# Patient Record
Sex: Female | Born: 1950
Health system: Southern US, Community
[De-identification: ages and names within clinical notes are randomized; demographics above are authoritative.]

## PROBLEM LIST (undated history)

## (undated) DIAGNOSIS — E119 Type 2 diabetes mellitus without complications: Secondary | ICD-10-CM

## (undated) DIAGNOSIS — I10 Essential (primary) hypertension: Secondary | ICD-10-CM

## (undated) DIAGNOSIS — E559 Vitamin D deficiency, unspecified: Secondary | ICD-10-CM

## (undated) HISTORY — DX: Vitamin D deficiency, unspecified: E55.9

---

## 2005-04-14 ENCOUNTER — Emergency Department (HOSPITAL_COMMUNITY): Admission: EM | Admit: 2005-04-14 | Discharge: 2005-04-14 | Payer: Self-pay | Admitting: Emergency Medicine

## 2013-06-29 ENCOUNTER — Encounter (HOSPITAL_COMMUNITY): Payer: Self-pay | Admitting: *Deleted

## 2013-06-29 ENCOUNTER — Emergency Department (HOSPITAL_COMMUNITY): Payer: Self-pay

## 2013-06-29 ENCOUNTER — Emergency Department (HOSPITAL_COMMUNITY)
Admission: EM | Admit: 2013-06-29 | Discharge: 2013-06-29 | Disposition: A | Discharge status: Home / Self Care | Payer: Self-pay | Attending: Emergency Medicine | Admitting: Emergency Medicine

## 2013-06-29 DIAGNOSIS — Y9239 Other specified sports and athletic area as the place of occurrence of the external cause: Secondary | ICD-10-CM | POA: Insufficient documentation

## 2013-06-29 DIAGNOSIS — S93609A Unspecified sprain of unspecified foot, initial encounter: Secondary | ICD-10-CM | POA: Insufficient documentation

## 2013-06-29 DIAGNOSIS — X500XXA Overexertion from strenuous movement or load, initial encounter: Secondary | ICD-10-CM | POA: Insufficient documentation

## 2013-06-29 DIAGNOSIS — Y9389 Activity, other specified: Secondary | ICD-10-CM | POA: Insufficient documentation

## 2013-06-29 DIAGNOSIS — E119 Type 2 diabetes mellitus without complications: Secondary | ICD-10-CM | POA: Insufficient documentation

## 2013-06-29 DIAGNOSIS — S93601A Unspecified sprain of right foot, initial encounter: Secondary | ICD-10-CM

## 2013-06-29 DIAGNOSIS — I10 Essential (primary) hypertension: Secondary | ICD-10-CM | POA: Insufficient documentation

## 2013-06-29 DIAGNOSIS — R609 Edema, unspecified: Secondary | ICD-10-CM | POA: Insufficient documentation

## 2013-06-29 DIAGNOSIS — Z880 Allergy status to penicillin: Secondary | ICD-10-CM | POA: Insufficient documentation

## 2013-06-29 HISTORY — DX: Type 2 diabetes mellitus without complications: E11.9

## 2013-06-29 HISTORY — DX: Essential (primary) hypertension: I10

## 2013-06-29 MED ORDER — LISINOPRIL-HYDROCHLOROTHIAZIDE 20-12.5 MG PO TABS: 2.0000 | ORAL_TABLET | Freq: Every day | ORAL | Status: DC

## 2013-06-29 NOTE — ED Notes (Signed)
Pt c/o right foot pain and swelling since Sunday night. Pt states she tripped over a chair and heard a "pop". Pt ambulatory on arrival but reports increased pain with walking.

## 2013-06-29 NOTE — ED Notes (Signed)
Pain lt foot/ankle -[place] beach [place]. Swelling

## 2013-06-30 NOTE — ED Provider Notes (Signed)
CSN: [id number]     Arrival date & time 06/29/13  2043 History     First MD Initiated Contact with Patient 06/29/13 2052     Chief Complaint  Patient presents with  . Foot Pain   (Consider location/radiation/quality/duration/timing/severity/associated sxs/prior Treatment) HPI Comments: Sheryl Mcknight is a 62 y.o. Female presenting with pain and swelling along the lateral and dorsal right foot after an injury 2 days ago, describing falling out of a chair,  And getting her right foot caught in the rungs,  Causing a twisting injury.  She has been weight bearing since the event,  But with increased pain and worse swelling toward the end of the day.  She has taken no medicines for this injury. Her pain is constant and aching without radiation into her leg.  She denies other injury.       The history is provided by the patient.    Past Medical History  Diagnosis Date  . Hypertension   . Diabetes mellitus without complication    Past Surgical History  Procedure Laterality Date  . Cesarean section     History reviewed. No pertinent family history. History  Substance Use Topics  . Smoking status: Never Smoker   . Smokeless tobacco: Not on file  . Alcohol Use: No   OB History   Grav Para Term Preterm Abortions TAB SAB Ect Mult Living                 Review of Systems  Musculoskeletal: Positive for joint swelling and arthralgias.  Skin: Negative for wound.  Neurological: Negative for weakness and numbness.    Allergies  Penicillins  Home Medications   Current Outpatient Rx  Name  Route  Sig  Dispense  Refill  . lisinopril-hydrochlorothiazide (ZESTORETIC) 20-12.5 MG per tablet   Oral   Take 2 tablets by mouth daily.   60 tablet   1    BP 184/90  Pulse 99  Temp(Src) 98.4 F (36.9 C) (Oral)  Resp 20  Ht 5\' 7"  (1.702 m)  SpO2 97% Physical Exam  Nursing note and vitals reviewed. Constitutional: She appears well-developed and well-nourished.  HENT:  Head:  Normocephalic.  Cardiovascular: Normal rate and intact distal pulses.  Exam reveals no decreased pulses.   Pulses:      Dorsalis pedis pulses are 2+ on the right side, and 2+ on the left side.       Posterior tibial pulses are 2+ on the right side, and 2+ on the left side.  Musculoskeletal: She exhibits edema and tenderness.       Right foot: She exhibits bony tenderness and swelling. She exhibits normal capillary refill, no crepitus and no deformity.       Feet:  Generalized edema of dorsal foot with point tenderness proximal foot.  She has ecchymosis along lateral edge of foot.  Edema noted at lateral malleolus, but non tender at this site. No proxial fibular tenderness.  Neurological: She is alert. No sensory deficit.  Skin: Skin is warm, dry and intact.    ED Course   Procedures (including critical care time)  Labs Reviewed - No data to display Dg Foot Complete Right  06/29/2013   *RADIOLOGY REPORT*  Clinical Data: Foot pain.  Trauma.  RIGHT FOOT COMPLETE - 3+ VIEW  Comparison: None.  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  There is no fracture.  Metatarsals appear intact.  Phalanges appear within normal limits.  Calcaneal spurs incidentally noted.  There is a tiny calcification dorsal to the navicular bone, compatible with capsular avulsion.  This is only seen on the lateral view.  IMPRESSION: Dorsal capsular avulsion from the navicular bone.   Original Report Authenticated By: Andreas Newport, M.D.   1. FSPR (foot sprain), right, initial encounter   2. Hypertension     MDM  Pt placed in aso,  Crutches given, advised RICE,  Referral to ortho for f/u care.  Also discussed elevated bp.  She states has been out of her lisinopril/hctz since lost insurance,  Unable to be seen by her prior pcp.  She denies headache, visual changes, dizziness, chest pain.  She was prescribed refill of her htn medicine.  She plans to f/u with the health dept for management of her ongoing  medical issues.  She does keep up with her cbg's, checked this week and 120.  Diet controlled.    Burgess Amor, PA-C 06/30/13 0126

## 2013-06-30 NOTE — ED Provider Notes (Signed)
Medical screening examination/treatment/procedure(s) were performed by non-physician practitioner and as supervising physician I was immediately available for consultation/collaboration.   Junius Argyle, MD 06/30/13 1126

## 2013-07-01 ENCOUNTER — Ambulatory Visit (INDEPENDENT_AMBULATORY_CARE_PROVIDER_SITE_OTHER): Payer: Self-pay | Admitting: Orthopedic Surgery

## 2013-07-01 ENCOUNTER — Encounter: Payer: Self-pay | Admitting: Orthopedic Surgery

## 2013-07-01 VITALS — BP 153/99 | Ht 67.0 in | Wt 186.0 lb

## 2013-07-01 DIAGNOSIS — S93601A Unspecified sprain of right foot, initial encounter: Secondary | ICD-10-CM

## 2013-07-01 DIAGNOSIS — S93609A Unspecified sprain of unspecified foot, initial encounter: Secondary | ICD-10-CM | POA: Insufficient documentation

## 2013-07-01 NOTE — Patient Instructions (Addendum)
Weight bearing as tolerated   Stop wearing ankle wrap

## 2013-07-01 NOTE — Progress Notes (Signed)
Patient ID: Sheryl Mcknight, female   DOB: August 06, 1951, 62 y.o.   MRN: [id number]  Chief Complaint  Patient presents with  . Foot Pain    Left foot fracture d/t injuryI 06/26/13    This patient twisted her foot when she stepped over a chair the foot slid under the chair and was entrapped  She really complains of mild 1/10 sharp throbbing intermittent pain that she had some initial bruising and swelling which has persisted she has some tingling otherwise doing well she has a history of wheezing heartburn or medical history as recorded vital signs are BP 153/99  Ht 5\' 7"  (1.702 m)  Wt 186 lb (84.369 kg)  BMI 29.12 kg/m2  Her appearance is normal she is oriented x3 mood is good her gait is normal she has some tenderness over the dorsum of the foot lateral ligaments of the ankle are normal ankle range of motion is normal ankle is stable motor exam is normal without atrophy skin is intact but bruised pulses good sensation is normal  Impression x-ray showed dorsal avulsion from the foot no ankle injury  Impression sprained foot  Activities as tolerated avoid strenuous activity or running for 4 weeks

## 2018-08-25 DIAGNOSIS — Z23 Encounter for immunization: Secondary | ICD-10-CM | POA: Diagnosis not present

## 2018-08-25 DIAGNOSIS — E559 Vitamin D deficiency, unspecified: Secondary | ICD-10-CM | POA: Diagnosis not present

## 2018-08-25 DIAGNOSIS — E119 Type 2 diabetes mellitus without complications: Secondary | ICD-10-CM | POA: Diagnosis not present

## 2018-08-25 DIAGNOSIS — N951 Menopausal and female climacteric states: Secondary | ICD-10-CM | POA: Diagnosis not present

## 2018-08-25 DIAGNOSIS — I10 Essential (primary) hypertension: Secondary | ICD-10-CM | POA: Diagnosis not present

## 2018-08-25 DIAGNOSIS — G4700 Insomnia, unspecified: Secondary | ICD-10-CM | POA: Diagnosis not present

## 2018-10-22 DIAGNOSIS — I10 Essential (primary) hypertension: Secondary | ICD-10-CM | POA: Diagnosis not present

## 2018-10-22 DIAGNOSIS — E1165 Type 2 diabetes mellitus with hyperglycemia: Secondary | ICD-10-CM | POA: Diagnosis not present

## 2018-12-29 DIAGNOSIS — E785 Hyperlipidemia, unspecified: Secondary | ICD-10-CM | POA: Diagnosis not present

## 2018-12-29 DIAGNOSIS — G4700 Insomnia, unspecified: Secondary | ICD-10-CM | POA: Diagnosis not present

## 2018-12-29 DIAGNOSIS — E1165 Type 2 diabetes mellitus with hyperglycemia: Secondary | ICD-10-CM | POA: Diagnosis not present

## 2018-12-29 DIAGNOSIS — I10 Essential (primary) hypertension: Secondary | ICD-10-CM | POA: Diagnosis not present

## 2019-03-03 ENCOUNTER — Ambulatory Visit (INDEPENDENT_AMBULATORY_CARE_PROVIDER_SITE_OTHER): Payer: Self-pay | Admitting: Nurse Practitioner

## 2019-03-03 ENCOUNTER — Other Ambulatory Visit (INDEPENDENT_AMBULATORY_CARE_PROVIDER_SITE_OTHER): Payer: Self-pay

## 2019-05-04 DIAGNOSIS — I10 Essential (primary) hypertension: Secondary | ICD-10-CM | POA: Diagnosis not present

## 2019-05-04 DIAGNOSIS — E559 Vitamin D deficiency, unspecified: Secondary | ICD-10-CM | POA: Diagnosis not present

## 2019-05-04 DIAGNOSIS — E1165 Type 2 diabetes mellitus with hyperglycemia: Secondary | ICD-10-CM | POA: Diagnosis not present

## 2019-08-02 ENCOUNTER — Other Ambulatory Visit (INDEPENDENT_AMBULATORY_CARE_PROVIDER_SITE_OTHER): Payer: Self-pay | Admitting: Internal Medicine

## 2019-08-19 ENCOUNTER — Ambulatory Visit (INDEPENDENT_AMBULATORY_CARE_PROVIDER_SITE_OTHER): Payer: Self-pay | Admitting: Internal Medicine

## 2019-09-02 ENCOUNTER — Other Ambulatory Visit (INDEPENDENT_AMBULATORY_CARE_PROVIDER_SITE_OTHER): Payer: Self-pay | Admitting: Internal Medicine

## 2019-09-02 MED ORDER — LISINOPRIL 20 MG PO TABS: 20.0000 mg | ORAL_TABLET | Freq: Two times a day (BID) | ORAL | 0 refills | Status: DC

## 2019-10-05 ENCOUNTER — Encounter (INDEPENDENT_AMBULATORY_CARE_PROVIDER_SITE_OTHER): Payer: Self-pay | Admitting: Internal Medicine

## 2019-10-05 ENCOUNTER — Ambulatory Visit (INDEPENDENT_AMBULATORY_CARE_PROVIDER_SITE_OTHER): Payer: Medicare Other | Admitting: Internal Medicine

## 2019-10-05 ENCOUNTER — Other Ambulatory Visit: Payer: Self-pay

## 2019-10-05 VITALS — BP 140/70 | HR 90 | Temp 97.9°F | Resp 18 | Ht 66.0 in | Wt 167.0 lb

## 2019-10-05 DIAGNOSIS — E559 Vitamin D deficiency, unspecified: Secondary | ICD-10-CM

## 2019-10-05 DIAGNOSIS — E119 Type 2 diabetes mellitus without complications: Secondary | ICD-10-CM | POA: Diagnosis not present

## 2019-10-05 DIAGNOSIS — I10 Essential (primary) hypertension: Secondary | ICD-10-CM | POA: Diagnosis not present

## 2019-10-05 NOTE — Patient Instructions (Signed)
[facility] Dietary Recommendations for Weight Loss What to Avoid . Avoid added sugars o Often added sugar can be found in processed foods such as many condiments, dry cereals, cakes, cookies, chips, crisps, crackers, candies, sweetened drinks, etc.  o Read labels and AVOID/DECREASE use of foods with the following in their ingredient list: Sugar, fructose, high fructose corn syrup, sucrose, glucose, maltose, dextrose, molasses, cane sugar, brown sugar, any type of syrup, agave nectar, etc.   . Avoid snacking in between meals . Avoid foods made with flour o If you are going to eat food made with flour, choose those made with whole-grains; and, minimize your consumption as much as is tolerable . Avoid processed foods o These foods are generally stocked in the middle of the grocery store. Focus on shopping on the perimeter of the grocery.  What to Include . Vegetables o GREEN LEAFY VEGETABLES: Kale, spinach, mustard greens, collard greens, cabbage, broccoli, etc. o OTHER: Asparagus, cauliflower, eggplant, carrots, peas, Brussel sprouts, tomatoes, bell peppers, zucchini, beets, cucumbers, etc. . Grains, seeds, and legumes o Beans: kidney beans, black eyed peas, garbanzo beans, black beans, pinto beans, etc. o Whole, unrefined grains: brown rice, barley, bulgur, oatmeal, etc. . Healthy fats  o Avoid highly processed fats such as vegetable oil o Examples of healthy fats: avocado, olives, virgin olive oil, dark chocolate (?72% Cocoa), nuts (peanuts, almonds, walnuts, cashews, pecans, etc.) . Low - Moderate Intake of Animal Sources of Protein o Meat sources: chicken, turkey, salmon, tuna. Limit to 4 ounces of meat at one time. o Consider limiting dairy sources, but when choosing dairy focus on: PLAIN Greek yogurt, cottage cheese, high-protein milk . Fruit o Choose berries  When to Eat . Intermittent Fasting: o Choosing not to eat for a specific time period, but DO FOCUS ON HYDRATION  when fasting o Multiple Techniques: - Time Restricted Eating: eat 3 meals in a day, each meal lasting no more than 60 minutes, no snacks between meals - 16-18 hour fast: fast for 16 to 18 hours up to 7 days a week. Often suggested to start with 2-3 nonconsecutive days per week.  . Remember the time you sleep is counted as fasting.  . Examples of eating schedule: Fast from 7:00pm-11:00am. Eat between 11:00am-7:00pm.  - 24-hour fast: fast for 24 hours up to every other day. Often suggested to start with 1 day per week . Remember the time you sleep is counted as fasting . Examples of eating schedule:  o Eating day: eat 2-3 meals on your eating day. If doing 2 meals, each meal should last no more than 90 minutes. If doing 3 meals, each meal should last no more than 60 minutes. Finish last meal by 7:00pm. o Fasting day: Fast until 7:00pm.  o IF YOU FEEL UNWELL FOR ANY REASON/IN ANY WAY WHEN FASTING, STOP FASTING BY EATING A NUTRITIOUS SNACK OR LIGHT MEAL o ALWAYS FOCUS ON HYDRATION DURING FASTS - Acceptable Hydration sources: water, broths, tea/coffee (black tea/coffee is best but using a small amount of whole-fat dairy products in coffee/tea is acceptable).  - Poor Hydration Sources: anything with sugar or artificial sweeteners added to it  These recommendations have been developed for patients that are actively receiving medical care from either Dr. Chareese Sergent or Sarah Gray, DNP, NP-C at [facility]. These recommendations are developed for patients with specific medical conditions and are not meant to be distributed or used by others that are not actively receiving care from either provider listed   above at [facility]. It is not appropriate to participate in the above eating plans without proper medical supervision.   Reference: Fung, J. The obesity code. [place]: [place]; 2016.   

## 2019-10-05 NOTE — Progress Notes (Signed)
Metrics: Intervention Frequency ACO  Documented Smoking Status Yearly  Screened one or more times in 24 months  Cessation Counseling or  Active cessation medication Past 24 months  Past 24 months   Guideline developer: UpToDate (See UpToDate for funding source) Date Released: 2014       Wellness Office Visit  Subjective:  Patient ID: Sheryl Mcknight, female    DOB: Feb 21, 1951  Age: 68 y.o. MRN: [id number]  CC: This lady comes in for follow-up of diabetes, hypertension and vitamin D deficiency. HPI  She has been doing reasonably well in terms of her diet.  Unfortunately, she finds it very hard to take Metformin because of GI side effects.  Nevertheless, she still perseveres with it. She continues with lisinopril for hypertension and seems to be tolerating it quite well.  She denies any chest pain, dyspnea, palpitations or limb weakness. She has been taking vitamin D3 supplementation but she cannot remember the dose and she will let me know after she gets home. Past Medical History:  Diagnosis Date  . Diabetes mellitus without complication ([place])   . Hypertension   . Vitamin D deficiency disease       History reviewed. No pertinent family history.  Social History   Social History Narrative   [place] murdered 20 yrs ago.Lives with 3 sons.Retired-previously worked in [employer] .   Social History   Tobacco Use  . Smoking status: Never Smoker  . Smokeless tobacco: Never Used  Substance Use Topics  . Alcohol use: No    Current Meds  Medication Sig  . acetaminophen (TYLENOL) 500 MG tablet Take 500 mg by mouth daily.  . cetirizine (ZYRTEC) 10 MG tablet Take 10 mg by mouth daily.  Marland Kitchen lisinopril (ZESTRIL) 20 MG tablet Take 1 tablet (20 mg total) by mouth 2 (two) times daily.  . metFORMIN (GLUCOPHAGE) 1000 MG tablet TAKE 1/2 (ONE-HALF) TABLET BY MOUTH TWICE DAILY - NEW DOSE        Objective:   Today's Vitals: BP 140/70 (BP Location: Left Arm, Patient Position: Sitting,  Cuff Size: Normal)   Pulse 90   Temp 97.9 F (36.6 C) (Temporal)   Resp 18   Ht '5\' 6"'  (1.676 m)   Wt 167 lb (75.8 kg)   SpO2 99% Comment: with mask  BMI 26.95 kg/m  Vitals with BMI 10/05/2019 07/01/2013 06/29/2013  Height '5\' 6"'  '5\' 7"'  '5\' 7"'   Weight 167 lbs 186 lbs -  BMI 63.84 53.6 -  Systolic 468 032 122  Diastolic 70 99 90  Pulse 90 - 99     Physical Exam  She looks systemically well.  Blood pressure is better controlled than it was last time now.  She is alert and orientated without any focal neurological signs.     Assessment   1. Vitamin D deficiency disease   2. Essential hypertension   3. Diabetes mellitus without complication ([place])       Tests ordered Orders Placed This Encounter  Procedures  . CMP with eGFR([facility])  . Hemoglobin A1c  . Vitamin D, 25-hydroxy     Plan: 1. Blood work is ordered above as above. 2. She will continue with vitamin D3 supplementation and we may need to adjust the dose depending on the level and what dose she is actually taking. 3. She will continue with lisinopril for hypertension and this seems to be controlling her blood pressure. 4. She will continue with Metformin for the time being but depending on her A1c, I may  need to change the medication as she does not seem to be tolerating this very well at all. 5. She will also come back next week to get influenza and pneumococcal 23 vaccination. 6. Further recommendations will depend on blood results and I will see her in 3 months time for follow-up.   No orders of the defined types were placed in this encounter.   Doree Albee, MD

## 2019-10-06 LAB — COMPLETE METABOLIC PANEL WITH GFR
AG Ratio: 1.3 (calc) (ref 1.0–2.5)
ALT: 23 U/L (ref 6–29)
AST: 20 U/L (ref 10–35)
Albumin: 4.4 g/dL (ref 3.6–5.1)
Alkaline phosphatase (APISO): 90 U/L (ref 37–153)
BUN: 20 mg/dL (ref 7–25)
CO2: 30 mmol/L (ref 20–32)
Calcium: 9.8 mg/dL (ref 8.6–10.4)
Chloride: 98 mmol/L (ref 98–110)
Creat: 0.74 mg/dL (ref 0.50–0.99)
GFR, Est African American: 98 mL/min/{1.73_m2} (ref >=60)
GFR, Est Non African American: 84 mL/min/{1.73_m2} (ref >=60)
Globulin: 3.5 g/dL (calc) (ref 1.9–3.7)
Glucose, Bld: 317 mg/dL — ABNORMAL HIGH (ref 65–99)
Potassium: 4.4 mmol/L (ref 3.5–5.3)
Sodium: 135 mmol/L (ref 135–146)
Total Bilirubin: 0.5 mg/dL (ref 0.2–1.2)
Total Protein: 7.9 g/dL (ref 6.1–8.1)

## 2019-10-06 LAB — HEMOGLOBIN A1C
Hgb A1c MFr Bld: 12 % of total Hgb — ABNORMAL HIGH (ref <5.7)
Mean Plasma Glucose: 298 (calc)
eAG (mmol/L): 16.5 (calc)

## 2019-10-06 LAB — VITAMIN D 25 HYDROXY (VIT D DEFICIENCY, FRACTURES): Vit D, 25-Hydroxy: 41 ng/mL (ref 30–100)

## 2019-10-11 ENCOUNTER — Other Ambulatory Visit (INDEPENDENT_AMBULATORY_CARE_PROVIDER_SITE_OTHER): Payer: Self-pay | Admitting: Internal Medicine

## 2019-10-11 MED ORDER — SITAGLIPTIN PHOSPHATE 50 MG PO TABS: 50.0000 mg | ORAL_TABLET | Freq: Every day | ORAL | 3 refills | Status: DC

## 2019-10-11 NOTE — Progress Notes (Signed)
Pt was instructed for the Jaunvia 50 mg take 1 daily. Will STOP the Metformin ; will need to pick up and start. Pt stated she is really upset on A1c. She was not eating anything bad. Pt was sick on stomach with metformin.

## 2019-10-11 NOTE — Progress Notes (Signed)
Pt will need A  Refill on the blood pressure medications also today sent to [facility].

## 2019-10-14 ENCOUNTER — Other Ambulatory Visit: Payer: Self-pay

## 2019-10-14 ENCOUNTER — Ambulatory Visit (INDEPENDENT_AMBULATORY_CARE_PROVIDER_SITE_OTHER): Payer: Medicare Other

## 2019-10-14 DIAGNOSIS — Z23 Encounter for immunization: Secondary | ICD-10-CM

## 2019-10-14 NOTE — Progress Notes (Signed)
Pt was given Pneumococcal 23 & High Dose influenza. One IM in each deltoid. Pt Tolerated well; no complaints.  Discussed the the peculations to do going forward  Due to virus and increase of COVID numbers. To stay safe during the holidays and new year.

## 2019-12-20 ENCOUNTER — Telehealth (INDEPENDENT_AMBULATORY_CARE_PROVIDER_SITE_OTHER): Payer: Self-pay

## 2019-12-20 NOTE — Telephone Encounter (Signed)
Pt said that she was told from pharmacy to take this and the metformin together.  Please call her back to verify that this is what she need her to do. She sis upset and wants to make sure she is taking the metformin. She said the Metformin before. (if she stops the Januvia she will not have no MD meds to take.)

## 2019-12-20 NOTE — Telephone Encounter (Signed)
Ask her to stop taking Januvia and she will need to follow-up with me or Maralyn Sago in the next week or 2 to see what other medications we can try for her diabetes.

## 2019-12-21 ENCOUNTER — Other Ambulatory Visit (INDEPENDENT_AMBULATORY_CARE_PROVIDER_SITE_OTHER): Payer: Self-pay | Admitting: Internal Medicine

## 2019-12-21 MED ORDER — GLIPIZIDE 5 MG PO TABS: 5.0000 mg | ORAL_TABLET | Freq: Two times a day (BID) | ORAL | 3 refills | Status: DC

## 2019-12-21 NOTE — Telephone Encounter (Signed)
Please let patient know that she should stop Metformin and Januvia.  I have sent a new prescription for glipizide 5 mg twice a day to see if this will help her diabetes get under better control. Follow-up with me as scheduled or she can see Maralyn Sago sooner in the next week or 2.

## 2019-12-21 NOTE — Telephone Encounter (Signed)
I have sent a new prescription for glipizide 5 mg twice a day to see if this will help her diabetes get under better control.  ON JANUVIA :SHE SAID SINCE NOT TAKING 2 DAYS NOT DM MEDICINES. PLUS SHE IS NOT HAVING HEADACHES TODAY, CBG WENT LOWER:203. IT WAS IN THE 300-400 ON THE OTHER MEDICATIONS PT STATED.

## 2019-12-23 NOTE — Telephone Encounter (Signed)
Pt called said she will take Rx and see what the daily log of CBG is to have when she come back to office.

## 2020-01-05 ENCOUNTER — Ambulatory Visit (INDEPENDENT_AMBULATORY_CARE_PROVIDER_SITE_OTHER): Payer: Medicare HMO | Admitting: Internal Medicine

## 2020-01-05 ENCOUNTER — Other Ambulatory Visit: Payer: Self-pay

## 2020-01-05 ENCOUNTER — Encounter (INDEPENDENT_AMBULATORY_CARE_PROVIDER_SITE_OTHER): Payer: Self-pay | Admitting: Internal Medicine

## 2020-01-05 DIAGNOSIS — I10 Essential (primary) hypertension: Secondary | ICD-10-CM

## 2020-01-05 DIAGNOSIS — E119 Type 2 diabetes mellitus without complications: Secondary | ICD-10-CM

## 2020-01-05 DIAGNOSIS — E559 Vitamin D deficiency, unspecified: Secondary | ICD-10-CM | POA: Diagnosis not present

## 2020-01-05 NOTE — Progress Notes (Signed)
Metrics: Intervention Frequency ACO  Documented Smoking Status Yearly  Screened one or more times in 24 months  Cessation Counseling or  Active cessation medication Past 24 months  Past 24 months   Guideline developer: UpToDate (See UpToDate for funding source) Date Released: 2014       Wellness Office Visit  Subjective:  Patient ID: Sheryl Mcknight, female    DOB: [birth year]  Age: 69 y.o. MRN: [id number]  CC: This is an audio telemedicine visit with the permission of the patient who is at home and I am in my office.  I was able to easily recognize her voice. This visit is to follow-up on her uncontrolled diabetes.  HPI The last time she was in the office her hemoglobin A1c was extremely uncontrolled over 12% and I had started her on Januvia.  She was not able to take this so we switched her to glipizide 5 mg twice a day.  She has done better with this but also more importantly, she has changed her diet and is trying to eat better.  She is not eating a lot of junk food like she used to.  She notices that her blood glucose levels are coming down and she does tend to feel better.  On average, her blood glucose levels are in the 100-120 range now. She continues with ACE inhibitor for hypertension. She has also started taking higher dose vitamin D3 supplementation for vitamin D deficiency.  Past Medical History:  Diagnosis Date  . Diabetes mellitus without complication ([facility])   . Hypertension   . Vitamin D deficiency disease       History reviewed. No pertinent family history.  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   Social History   Tobacco Use  . Smoking status: Never Smoker  . Smokeless tobacco: Never Used  Substance Use Topics  . Alcohol use: No    Current Meds  Medication Sig  . Cholecalciferol (VITAMIN D-3) 125 MCG (5000 UT) TABS Take 1 tablet by mouth daily.  Marland Kitchen glipiZIDE  (GLUCOTROL) 5 MG tablet Take 1 tablet (5 mg total) by mouth 2 (two) times daily before a meal.  . lisinopril (ZESTRIL) 20 MG tablet Take 1 tablet by mouth twice daily  . [DISCONTINUED] acetaminophen (TYLENOL) 500 MG tablet Take 500 mg by mouth daily.  . [DISCONTINUED] cetirizine (ZYRTEC) 10 MG tablet Take 10 mg by mouth daily.  . [DISCONTINUED] sitaGLIPtin (JANUVIA) 50 MG tablet Take 1 tablet (50 mg total) by mouth daily.      Objective:   Today's Vitals: There were no vitals taken for this visit. Vitals with BMI 01/05/2020 10/05/2019 07/01/2013  Height (No Data) 5\' 6"  5\' 7"   Weight (No Data) 167 lbs 186 lbs  BMI - 26.97 29.2  Systolic (No Data) 140 153  Diastolic (No Data) 70 99  Pulse - 90 -     Physical Exam   Her voice appeared normal on the phone and she appeared to be alert and orientated and responds appropriately.     Assessment   1. Diabetes mellitus without complication (H[facility])   2. Essential hypertension   3. Vitamin D deficiency disease       Tests ordered No orders of the defined types were placed in this encounter.    Plan: 1. She will continue with all medications for diabetes and continue to eat in a better fashion. 2. She will continue  with lisinopril for hypertension. 3. She will continue with vitamin D3 supplementation. 4. I will see her back in about a month time in the office to see how she is doing and we will check blood work then. 5. This phone call lasted 7 minutes and 43 seconds.   No orders of the defined types were placed in this encounter.   Doree Albee, MD

## 2020-01-12 ENCOUNTER — Ambulatory Visit (INDEPENDENT_AMBULATORY_CARE_PROVIDER_SITE_OTHER): Payer: Medicare Other | Admitting: Internal Medicine

## 2020-02-02 ENCOUNTER — Ambulatory Visit (INDEPENDENT_AMBULATORY_CARE_PROVIDER_SITE_OTHER): Payer: Medicare HMO | Admitting: Internal Medicine

## 2020-02-07 ENCOUNTER — Other Ambulatory Visit (INDEPENDENT_AMBULATORY_CARE_PROVIDER_SITE_OTHER): Payer: Self-pay

## 2020-02-07 MED ORDER — LISINOPRIL 20 MG PO TABS: 20.0000 mg | ORAL_TABLET | Freq: Two times a day (BID) | ORAL | 0 refills | Status: DC

## 2020-02-24 ENCOUNTER — Encounter (INDEPENDENT_AMBULATORY_CARE_PROVIDER_SITE_OTHER): Payer: Self-pay | Admitting: Internal Medicine

## 2020-02-24 ENCOUNTER — Ambulatory Visit (INDEPENDENT_AMBULATORY_CARE_PROVIDER_SITE_OTHER): Payer: Medicare HMO | Admitting: Internal Medicine

## 2020-02-24 ENCOUNTER — Other Ambulatory Visit: Payer: Self-pay

## 2020-02-24 VITALS — BP 142/80 | HR 84 | Temp 96.4°F | Resp 18 | Ht 65.0 in | Wt 165.0 lb

## 2020-02-24 DIAGNOSIS — E119 Type 2 diabetes mellitus without complications: Secondary | ICD-10-CM

## 2020-02-24 DIAGNOSIS — I10 Essential (primary) hypertension: Secondary | ICD-10-CM

## 2020-02-24 DIAGNOSIS — E559 Vitamin D deficiency, unspecified: Secondary | ICD-10-CM | POA: Diagnosis not present

## 2020-02-24 MED ORDER — CLOTRIMAZOLE-BETAMETHASONE 1-0.05 % EX CREA: 1.0000 "application " | TOPICAL_CREAM | Freq: Two times a day (BID) | CUTANEOUS | 0 refills | Status: DC

## 2020-02-24 NOTE — Progress Notes (Signed)
Metrics: Intervention Frequency ACO  Documented Smoking Status Yearly  Screened one or more times in 24 months  Cessation Counseling or  Active cessation medication Past 24 months  Past 24 months   Guideline developer: UpToDate (See UpToDate for funding source) Date Released: 2014       Wellness Office Visit  Subjective:  Patient ID: Sheryl Mcknight, female    DOB: 03-Nov-1951  Age: 69 y.o. MRN: [id number]  CC: This lady comes in for uncontrolled diabetes, hypertension and vitamin D deficiency. HPI  She has been doing better since the last visit and has changed her diet to some good degree.  She tries to stay away from unhealthy foods.  She is tolerating glipizide twice a day and the higher sugar level she is getting now is in the 220 range. She continues with lisinopril for hypertension. She continues on vitamin D3 supplementation for vitamin D deficiency. She does not have a history of coronary artery disease or cerebrovascular disease. Past Medical History:  Diagnosis Date  . Diabetes mellitus without complication ([place])   . Hypertension   . Vitamin D deficiency disease       History reviewed. No pertinent family history.  Social History   Social History Narrative   [place] murdered 20 yrs ago.Lives with 3 sons.Retired-previously worked in [employer] .   Social History   Tobacco Use  . Smoking status: Never Smoker  . Smokeless tobacco: Never Used  Substance Use Topics  . Alcohol use: No    Current Meds  Medication Sig  . Cholecalciferol (VITAMIN D-3) 125 MCG (5000 UT) TABS Take 1 tablet by mouth daily.  Marland Kitchen glipiZIDE (GLUCOTROL) 5 MG tablet Take 1 tablet (5 mg total) by mouth 2 (two) times daily before a meal.  . lisinopril (ZESTRIL) 20 MG tablet Take 1 tablet (20 mg total) by mouth 2 (two) times daily.      Objective:   Today's Vitals: BP (!) 142/80 (BP Location: Right Arm, Patient Position: Sitting, Cuff Size: Normal)   Pulse 84   Temp (!) 96.4 F (35.8  C) (Temporal)   Resp 18   Ht 5\' 5"  (1.651 m)   Wt 165 lb (74.8 kg)   SpO2 98%   BMI 27.46 kg/m  Vitals with BMI 02/24/2020 01/05/2020 10/05/2019  Height 5\' 5"  (No Data) 5\' 6"   Weight 165 lbs (No Data) 167 lbs  BMI 54.27 - 06.23  Systolic 762 (No Data) 831  Diastolic 80 (No Data) 70  Pulse 84 - 90     Physical Exam   She looks systemically well and she has lost a couple pounds since the last visit.  Blood pressure reasonable today.  Alert and orientated without any focal neurological signs    Assessment   1. Diabetes mellitus without complication ([place])   2. Essential hypertension   3. Vitamin D deficiency disease       Tests ordered Orders Placed This Encounter  Procedures  . COMPLETE METABOLIC PANEL WITH GFR  . Hemoglobin A1c  . VITAMIN D 25 Hydroxy (Vit-D Deficiency, Fractures)  . Lipid panel     Plan: 1. Blood work is ordered above. 2. She probably should be on a statin in view of her uncontrolled diabetes.  We will see what the lipid panel shows. 3. She will continue with antihypertensive therapy and this seems to be controlling her blood pressure reasonably well. 4. She will continue with glipizide for the time being but I would probably in the long-term not favor  this as glipizide increase his insulin levels.  In regard to increased insulin levels, told her that she should fast for 16 hours every day and this will probably improve her diabetes. 5. Further recommendations will depend on blood results and I will see her in 3 months time for an annual physical exam.   Meds ordered this encounter  Medications  . clotrimazole-betamethasone (LOTRISONE) cream    Sig: Apply 1 application topically 2 (two) times daily.    Dispense:  30 g    Refill:  0    Besse Miron Normajean Glasgow, MD

## 2020-02-25 LAB — COMPLETE METABOLIC PANEL WITH GFR
AG Ratio: 1.1 (calc) (ref 1.0–2.5)
ALT: 24 U/L (ref 6–29)
AST: 22 U/L (ref 10–35)
Albumin: 4.4 g/dL (ref 3.6–5.1)
Alkaline phosphatase (APISO): 135 U/L (ref 37–153)
BUN: 19 mg/dL (ref 7–25)
CO2: 31 mmol/L (ref 20–32)
Calcium: 9.4 mg/dL (ref 8.6–10.4)
Chloride: 99 mmol/L (ref 98–110)
Creat: 0.91 mg/dL (ref 0.50–0.99)
GFR, Est African American: 76 mL/min/{1.73_m2} (ref >=60)
GFR, Est Non African American: 65 mL/min/{1.73_m2} (ref >=60)
Globulin: 3.9 g/dL (calc) — ABNORMAL HIGH (ref 1.9–3.7)
Glucose, Bld: 252 mg/dL — ABNORMAL HIGH (ref 65–99)
Potassium: 4.1 mmol/L (ref 3.5–5.3)
Sodium: 138 mmol/L (ref 135–146)
Total Bilirubin: 0.5 mg/dL (ref 0.2–1.2)
Total Protein: 8.3 g/dL — ABNORMAL HIGH (ref 6.1–8.1)

## 2020-02-25 LAB — HEMOGLOBIN A1C
Hgb A1c MFr Bld: 9 % of total Hgb — ABNORMAL HIGH (ref <5.7)
Mean Plasma Glucose: 212 (calc)
eAG (mmol/L): 11.7 (calc)

## 2020-02-25 LAB — VITAMIN D 25 HYDROXY (VIT D DEFICIENCY, FRACTURES): Vit D, 25-Hydroxy: 50 ng/mL (ref 30–100)

## 2020-02-25 LAB — LIPID PANEL
Cholesterol: 140 mg/dL (ref <200)
HDL: 42 mg/dL — ABNORMAL LOW (ref >=50)
LDL Cholesterol (Calc): 74 mg/dL (calc)
Non-HDL Cholesterol (Calc): 98 mg/dL (calc) (ref <130)
Total CHOL/HDL Ratio: 3.3 (calc) (ref <5.0)
Triglycerides: 166 mg/dL — ABNORMAL HIGH (ref <150)

## 2020-02-28 NOTE — Progress Notes (Signed)
Patient called. Pt was let  know that her diabetes is much better with a hemoglobin A1c now at 9% compared to 12%.  Keep up the good work and modify the diet as we discussed.  Follow-up as scheduled

## 2020-03-20 ENCOUNTER — Other Ambulatory Visit (INDEPENDENT_AMBULATORY_CARE_PROVIDER_SITE_OTHER): Payer: Self-pay

## 2020-03-20 MED ORDER — GLIPIZIDE 5 MG PO TABS: 5.0000 mg | ORAL_TABLET | Freq: Two times a day (BID) | ORAL | 3 refills | Status: DC

## 2020-05-18 ENCOUNTER — Other Ambulatory Visit (INDEPENDENT_AMBULATORY_CARE_PROVIDER_SITE_OTHER): Payer: Self-pay | Admitting: Internal Medicine

## 2020-05-25 ENCOUNTER — Encounter (INDEPENDENT_AMBULATORY_CARE_PROVIDER_SITE_OTHER): Payer: Medicare HMO | Admitting: Internal Medicine

## 2020-07-04 ENCOUNTER — Encounter (INDEPENDENT_AMBULATORY_CARE_PROVIDER_SITE_OTHER): Payer: Medicare HMO | Admitting: Internal Medicine

## 2020-07-17 ENCOUNTER — Other Ambulatory Visit (INDEPENDENT_AMBULATORY_CARE_PROVIDER_SITE_OTHER): Payer: Self-pay | Admitting: Internal Medicine

## 2020-07-18 ENCOUNTER — Other Ambulatory Visit (INDEPENDENT_AMBULATORY_CARE_PROVIDER_SITE_OTHER): Payer: Self-pay | Admitting: Internal Medicine

## 2020-07-27 ENCOUNTER — Other Ambulatory Visit: Payer: Self-pay

## 2020-07-27 ENCOUNTER — Encounter (INDEPENDENT_AMBULATORY_CARE_PROVIDER_SITE_OTHER): Payer: Self-pay | Admitting: Nurse Practitioner

## 2020-07-27 ENCOUNTER — Ambulatory Visit (INDEPENDENT_AMBULATORY_CARE_PROVIDER_SITE_OTHER): Payer: Medicare HMO | Admitting: Nurse Practitioner

## 2020-07-27 VITALS — BP 170/86 | HR 87 | Temp 98.1°F | Resp 16 | Ht 67.0 in | Wt 163.4 lb

## 2020-07-27 DIAGNOSIS — R300 Dysuria: Secondary | ICD-10-CM

## 2020-07-27 DIAGNOSIS — E559 Vitamin D deficiency, unspecified: Secondary | ICD-10-CM

## 2020-07-27 DIAGNOSIS — Z1329 Encounter for screening for other suspected endocrine disorder: Secondary | ICD-10-CM

## 2020-07-27 DIAGNOSIS — E119 Type 2 diabetes mellitus without complications: Secondary | ICD-10-CM

## 2020-07-27 DIAGNOSIS — Z0001 Encounter for general adult medical examination with abnormal findings: Secondary | ICD-10-CM | POA: Diagnosis not present

## 2020-07-27 DIAGNOSIS — R21 Rash and other nonspecific skin eruption: Secondary | ICD-10-CM | POA: Diagnosis not present

## 2020-07-27 DIAGNOSIS — I10 Essential (primary) hypertension: Secondary | ICD-10-CM

## 2020-07-27 MED ORDER — CLOTRIMAZOLE-BETAMETHASONE 1-0.05 % EX CREA: 1.0000 "application " | TOPICAL_CREAM | Freq: Two times a day (BID) | CUTANEOUS | 0 refills | Status: DC

## 2020-07-27 NOTE — Progress Notes (Signed)
Subjective:  Patient ID: Sheryl Mcknight, female    DOB: [birth year]  Age: 69 y.o. MRN: [id number]  CC:  Chief Complaint  Patient presents with  . Annual Exam      HPI  This patient arrives today for the above.  She tells me overall she is feeling well.  She tells me she is quite nervous today coming to this appointment.  Her main concern is what her sugar levels will be on her blood work.  She tells me at home sugar is quite labile and will sometimes be above 200 even when fasting.  She tells me she tries to eat healthy and avoids sugary fruits, breads, and processed carbohydrates.  She tells me she is working out regularly and drinks water frequently throughout the day.  She is due for tetanus, shingles, and is in the middle of her COVID-19 vaccination series.  She is unwilling to have colon cancer screening, mammogram, hepatitis C screening, and bone density scan at this time.  She is been experiencing some mild dysuria since this morning and is concerned she may have a urinary tract infection.  Past Medical History:  Diagnosis Date  . Diabetes mellitus without complication ([place])   . Hypertension   . Vitamin D deficiency disease       History reviewed. No pertinent family history.  Social History   Social History Narrative   [place] murdered 20 yrs ago.Lives with 3 sons.Retired-previously worked in [employer] .   Social History   Tobacco Use  . Smoking status: Never Smoker  . Smokeless tobacco: Never Used  Substance Use Topics  . Alcohol use: No     Current Meds  Medication Sig  . Cholecalciferol (VITAMIN D-3) 125 MCG (5000 UT) TABS Take 1 tablet by mouth daily.  Marland Kitchen glipiZIDE (GLUCOTROL) 5 MG tablet TAKE 1 TABLET BY MOUTH TWICE DAILY BEFORE A MEAL  . lisinopril (ZESTRIL) 20 MG tablet Take 1 tablet by mouth twice daily  . [DISCONTINUED] clotrimazole-betamethasone (LOTRISONE) cream Apply 1 application topically 2 (two) times daily.    ROS:  Review of Systems    Constitutional: Negative.   Eyes: Negative.   Respiratory: Negative.   Cardiovascular: Negative.   Genitourinary: Positive for dysuria. Negative for hematuria.  Neurological: Negative.   Psychiatric/Behavioral: Negative.      Objective:   Today's Vitals: BP (!) 170/86   Pulse 87   Temp 98.1 F (36.7 C)   Resp 16   Ht 5' 7"  (1.702 m)   Wt 163 lb 6.4 oz (74.1 kg)   SpO2 94%   BMI 25.59 kg/m  Vitals with BMI 07/27/2020 02/24/2020 01/05/2020  Height 5' 7"  5' 5"  (No Data)  Weight 163 lbs 6 oz 165 lbs (No Data)  BMI 72.62 03.55 -  Systolic 974 163 (No Data)  Diastolic 86 80 (No Data)  Pulse 87 84 -     Physical Exam Vitals reviewed.  Constitutional:      Appearance: Normal appearance.  HENT:     Head: Normocephalic and atraumatic.     Right Ear: Tympanic membrane, ear canal and external ear normal.     Left Ear: Tympanic membrane, ear canal and external ear normal.  Eyes:     General:        Right eye: No discharge.        Left eye: No discharge.     Extraocular Movements: Extraocular movements intact.     Conjunctiva/sclera: Conjunctivae normal.  Pupils: Pupils are equal, round, and reactive to light.  Neck:     Vascular: No carotid bruit.  Cardiovascular:     Rate and Rhythm: Normal rate and regular rhythm.     Pulses: Normal pulses.          Dorsalis pedis pulses are 2+ on the right side and 2+ on the left side.     Heart sounds: Normal heart sounds. No murmur heard.   Pulmonary:     Effort: Pulmonary effort is normal.     Breath sounds: Normal breath sounds.  Chest:     Breasts: Breasts are symmetrical.        Right: Normal.        Left: Normal.  Abdominal:     General: Abdomen is flat. Bowel sounds are normal. There is no distension.     Palpations: Abdomen is soft. There is no mass.     Tenderness: There is no abdominal tenderness.  Musculoskeletal:        General: No tenderness.     Cervical back: Neck supple. No muscular tenderness.     Right  lower leg: No edema.     Left lower leg: No edema.     Right foot: No deformity.     Left foot: No deformity.  Feet:     Right foot:     Protective Sensation: 10 sites tested. 10 sites sensed.     Skin integrity: Skin integrity normal.     Toenail Condition: Right toenails are normal.     Left foot:     Protective Sensation: 10 sites tested. 9 sites sensed.     Skin integrity: Skin integrity normal.     Toenail Condition: Left toenails are normal.  Lymphadenopathy:     Cervical: No cervical adenopathy.     Upper Body:     Right upper body: No supraclavicular adenopathy.     Left upper body: No supraclavicular adenopathy.  Skin:    General: Skin is warm and dry.  Neurological:     General: No focal deficit present.     Mental Status: She is alert and oriented to person, place, and time.     Motor: No weakness.     Gait: Gait normal.  Psychiatric:        Mood and Affect: Mood normal.        Behavior: Behavior normal.        Judgment: Judgment normal.       Depression screen [facility] 2/9 07/27/2020 02/24/2020  Decreased Interest 0 0  Down, Depressed, Hopeless 1 0  PHQ - 2 Score 1 0      Assessment and Plan   1. Encounter for general adult medical examination with abnormal findings   2. Rash   3. Diabetes mellitus without complication ([place])   4. Essential hypertension   5. Vitamin D deficiency disease   6. Screening for thyroid disorder   7. Dysuria      Plan: 1.  I encouraged her to talk to her pharmacist about having tetanus shot and shingles vaccine administered.  I recommended she wait until at least 2 weeks after her last Covid 19 vaccine before having any other vaccines administered.  She tells me she understands.  I encouraged her to let me know she changes her mind on colon cancer screening, breast cancer screening, hepatitis C screening, or osteoporosis screening.  She tells me she understands.  Encouraged her to follow-up with her eye doctor to have  her annual  eye exam. 2.  Request for refill of Lotrisone approved today.  Rx sent to pharmacy, she was told to use this for no longer than 2 weeks at one time without taking a break. 3.-6.  We will check blood work for further evaluation today she will continue on her current medications as prescribed.  Blood pressure above goal today, will keep her on current medication as prescribed as she reported being quite nervous today and previous visit showed much better control blood pressure.  May need to make adjustments to medications if blood pressure remains elevated. 7.  We will check urine by sending off for urinalysis with reflex to culture.   Tests ordered Orders Placed This Encounter  Procedures  . CMP with eGFR([facility])  . CBC  . Hemoglobin A1c  . TSH  . Vitamin D, 25-hydroxy  . Urinalysis with Culture Reflex      Meds ordered this encounter  Medications  . clotrimazole-betamethasone (LOTRISONE) cream    Sig: Apply 1 application topically 2 (two) times daily.    Dispense:  30 g    Refill:  0    Order Specific Question:   Supervising Provider    Answer:   Doree Albee [[id number]]    Patient to follow-up in 6 weeks for close follow-up regarding blood pressure.  In addition to performing her annual physical exam today also performed an office visit to address her concerns.  Ailene Ards, NP

## 2020-07-28 ENCOUNTER — Other Ambulatory Visit (INDEPENDENT_AMBULATORY_CARE_PROVIDER_SITE_OTHER): Payer: Self-pay | Admitting: Nurse Practitioner

## 2020-07-28 DIAGNOSIS — N3000 Acute cystitis without hematuria: Secondary | ICD-10-CM

## 2020-07-28 MED ORDER — NITROFURANTOIN MONOHYD MACRO 100 MG PO CAPS: 100.0000 mg | ORAL_CAPSULE | Freq: Two times a day (BID) | ORAL | 0 refills | Status: DC

## 2020-07-28 NOTE — Progress Notes (Signed)
I attempted to call this patient today to discuss her lab results.  Unfortunately she did not answer the phone and her voicemail was not set up so I was unable to leave a message.  I have sent a prescription to her pharmacy to treat her probable urinary tract infection.  Will await culture results to determine if antibiotic needs to be changed.

## 2020-07-30 LAB — COMPLETE METABOLIC PANEL WITH GFR
AG Ratio: 1.2 (calc) (ref 1.0–2.5)
ALT: 22 U/L (ref 6–29)
AST: 19 U/L (ref 10–35)
Albumin: 4.6 g/dL (ref 3.6–5.1)
Alkaline phosphatase (APISO): 177 U/L — ABNORMAL HIGH (ref 37–153)
BUN: 18 mg/dL (ref 7–25)
CO2: 33 mmol/L — ABNORMAL HIGH (ref 20–32)
Calcium: 9.7 mg/dL (ref 8.6–10.4)
Chloride: 99 mmol/L (ref 98–110)
Creat: 0.75 mg/dL (ref 0.50–0.99)
GFR, Est African American: 96 mL/min/{1.73_m2} (ref >=60)
GFR, Est Non African American: 82 mL/min/{1.73_m2} (ref >=60)
Globulin: 3.9 g/dL (calc) — ABNORMAL HIGH (ref 1.9–3.7)
Glucose, Bld: 336 mg/dL — ABNORMAL HIGH (ref 65–99)
Potassium: 5.1 mmol/L (ref 3.5–5.3)
Sodium: 137 mmol/L (ref 135–146)
Total Bilirubin: 0.4 mg/dL (ref 0.2–1.2)
Total Protein: 8.5 g/dL — ABNORMAL HIGH (ref 6.1–8.1)

## 2020-07-30 LAB — URINALYSIS W MICROSCOPIC + REFLEX CULTURE
Bilirubin Urine: NEGATIVE
Hgb urine dipstick: NEGATIVE
Hyaline Cast: NONE SEEN /LPF
Ketones, ur: NEGATIVE
Nitrites, Initial: NEGATIVE
Specific Gravity, Urine: 1.025 (ref 1.001–1.03)
pH: 5.5 (ref 5.0–8.0)

## 2020-07-30 LAB — HEMOGLOBIN A1C
Hgb A1c MFr Bld: 9.8 % of total Hgb — ABNORMAL HIGH (ref <5.7)
Mean Plasma Glucose: 235 (calc)
eAG (mmol/L): 13 (calc)

## 2020-07-30 LAB — CBC
HCT: 50.4 % — ABNORMAL HIGH (ref 35.0–45.0)
Hemoglobin: 16.7 g/dL — ABNORMAL HIGH (ref 11.7–15.5)
MCH: 28.8 pg (ref 27.0–33.0)
MCHC: 33.1 g/dL (ref 32.0–36.0)
MCV: 86.9 fL (ref 80.0–100.0)
MPV: 11.7 fL (ref 7.5–12.5)
Platelets: 251 10*3/uL (ref 140–400)
RBC: 5.8 10*6/uL — ABNORMAL HIGH (ref 3.80–5.10)
RDW: 11.7 % (ref 11.0–15.0)
WBC: 7.9 10*3/uL (ref 3.8–10.8)

## 2020-07-30 LAB — URINE CULTURE

## 2020-07-30 LAB — CULTURE INDICATED

## 2020-07-30 LAB — VITAMIN D 25 HYDROXY (VIT D DEFICIENCY, FRACTURES): Vit D, 25-Hydroxy: 48 ng/mL (ref 30–100)

## 2020-07-30 LAB — TSH: TSH: 0.34 mIU/L — ABNORMAL LOW (ref 0.40–4.50)

## 2020-08-01 NOTE — Progress Notes (Signed)
She is exercise and eating a balance meal not sure what makes her A1c high. She was saying stressing a great deal.

## 2020-08-23 ENCOUNTER — Other Ambulatory Visit (INDEPENDENT_AMBULATORY_CARE_PROVIDER_SITE_OTHER): Payer: Self-pay | Admitting: Nurse Practitioner

## 2020-08-31 ENCOUNTER — Telehealth (INDEPENDENT_AMBULATORY_CARE_PROVIDER_SITE_OTHER): Payer: Self-pay

## 2020-08-31 NOTE — Telephone Encounter (Signed)
Dtr Lynden Ang in [place]  called and wanted to see if Dr.Gosrani will talk to her and suggest her manage her emotions and feelings.  All her kids have talk to her to see her. The dtr live in [place], she storms out and gets mad and cry. Feeling of worthless ness. When parent she have showing more problems with mood.

## 2020-08-31 NOTE — Telephone Encounter (Signed)
We can set up an appointment for the patient next week and the daughter can be on the phone if necessary.  Either Maralyn Sago or myself can see her.

## 2020-09-12 ENCOUNTER — Ambulatory Visit (INDEPENDENT_AMBULATORY_CARE_PROVIDER_SITE_OTHER): Payer: Medicare HMO | Admitting: Internal Medicine

## 2020-09-21 ENCOUNTER — Ambulatory Visit (INDEPENDENT_AMBULATORY_CARE_PROVIDER_SITE_OTHER): Payer: Medicare HMO

## 2020-10-19 ENCOUNTER — Encounter (INDEPENDENT_AMBULATORY_CARE_PROVIDER_SITE_OTHER): Payer: Self-pay | Admitting: Internal Medicine

## 2020-10-19 ENCOUNTER — Other Ambulatory Visit: Payer: Self-pay

## 2020-10-19 ENCOUNTER — Ambulatory Visit (INDEPENDENT_AMBULATORY_CARE_PROVIDER_SITE_OTHER): Payer: Medicare HMO | Admitting: Internal Medicine

## 2020-10-19 VITALS — BP 154/102 | HR 83 | Temp 97.9°F | Ht 67.0 in | Wt 167.6 lb

## 2020-10-19 DIAGNOSIS — E1165 Type 2 diabetes mellitus with hyperglycemia: Secondary | ICD-10-CM | POA: Diagnosis not present

## 2020-10-19 DIAGNOSIS — I10 Essential (primary) hypertension: Secondary | ICD-10-CM

## 2020-10-19 DIAGNOSIS — Z23 Encounter for immunization: Secondary | ICD-10-CM

## 2020-10-19 MED ORDER — HYDROCHLOROTHIAZIDE 25 MG PO TABS: 25.0000 mg | ORAL_TABLET | Freq: Every day | ORAL | 3 refills | Status: DC

## 2020-10-19 MED ORDER — SITAGLIPTIN PHOSPHATE 50 MG PO TABS: 50.0000 mg | ORAL_TABLET | Freq: Every day | ORAL | 3 refills | Status: DC

## 2020-10-19 NOTE — Addendum Note (Signed)
Addended by: Ronita Hipps on: 10/19/2020 04:51 PM   Modules accepted: Orders

## 2020-10-19 NOTE — Progress Notes (Signed)
Metrics: Intervention Frequency ACO  Documented Smoking Status Yearly  Screened one or more times in 24 months  Cessation Counseling or  Active cessation medication Past 24 months  Past 24 months   Guideline developer: UpToDate (See UpToDate for funding source) Date Released: 2014       Wellness Office Visit  Subjective:  Patient ID: Sheryl Mcknight, female    DOB: April 22, 1951  Age: 69 y.o. MRN: [id number]  CC: This lady comes in for follow-up of uncontrolled diabetes and hypertension. HPI She continues on lisinopril twice a day.  She denies any headaches, chest pain, dyspnea or palpitations. She continues on glipizide twice a day also.  Her last hemoglobin A1c was 9.8%, showing poorly controlled diabetes.  She says she eats only once a day or even hardly eats and still her blood sugars are elevated.  Past Medical History:  Diagnosis Date  . Diabetes mellitus without complication (H[facility])   . Hypertension   . Vitamin D deficiency disease    Past Surgical History:  Procedure Laterality Date  . CESAREAN SECTION       History reviewed. No pertinent family history.  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   Social History   Tobacco Use  . Smoking status: Never Smoker  . Smokeless tobacco: Never Used  Substance Use Topics  . Alcohol use: No    Current Meds  Medication Sig  . Cholecalciferol (VITAMIN D-3) 125 MCG (5000 UT) TABS Take 1 tablet by mouth daily.  . clotrimazole-betamethasone (LOTRISONE) cream Apply 1 application topically 2 (two) times daily.  Marland Kitchen glipiZIDE (GLUCOTROL) 5 MG tablet TAKE 1 TABLET BY MOUTH TWICE DAILY BEFORE A MEAL  . lisinopril (ZESTRIL) 20 MG tablet Take 1 tablet by mouth twice daily      Depression screen [facility] 2/9 07/27/2020 02/24/2020  Decreased Interest 0 0  Down, Depressed, Hopeless 1 0  PHQ - 2 Score 1 0     Objective:   Today's Vitals: BP  (!) 154/102   Pulse 83   Temp 97.9 F (36.6 C) (Temporal)   Ht 5\' 7"  (1.702 m)   Wt 167 lb 9.6 oz (76 kg)   SpO2 97%   BMI 26.25 kg/m  Vitals with BMI 10/19/2020 07/27/2020 02/24/2020  Height 5\' 7"  5\' 7"  5\' 5"   Weight 167 lbs 10 oz 163 lbs 6 oz 165 lbs  BMI 26.24 25.59 27.46  Systolic 154 170 02/26/2020  Diastolic 102 86 80  Pulse 83 87 84     Physical Exam   Her blood pressure is uncontrolled.  She is alert and orientated without any focal medical signs.    Assessment   1. Uncontrolled type 2 diabetes mellitus with hyperglycemia (HCC)   2. Essential hypertension       Tests ordered No orders of the defined types were placed in this encounter.    Plan: 1. In addition to the glipizide, I am going to add Januvia to her medication for diabetes and hopefully this will improve matters. 2. We will add hydrochlorothiazide to the lisinopril to see if her blood pressure can be better controlled. 3. Follow-up with in the next 6 weeks or so and she will have blood work done at that time including A1c. 4. Influenza vaccination was given today.   Meds ordered this encounter  Medications  . hydrochlorothiazide (HYDRODIURIL) 25 MG tablet    Sig: Take 1 tablet (  25 mg total) by mouth daily.    Dispense:  30 tablet    Refill:  3  . sitaGLIPtin (JANUVIA) 50 MG tablet    Sig: Take 1 tablet (50 mg total) by mouth daily.    Dispense:  30 tablet    Refill:  3    Joeanthony Seeling Normajean Glasgow, MD

## 2020-11-13 ENCOUNTER — Other Ambulatory Visit (INDEPENDENT_AMBULATORY_CARE_PROVIDER_SITE_OTHER): Payer: Self-pay | Admitting: Internal Medicine

## 2020-11-29 ENCOUNTER — Ambulatory Visit (INDEPENDENT_AMBULATORY_CARE_PROVIDER_SITE_OTHER): Payer: Medicare HMO | Admitting: Nurse Practitioner

## 2020-12-14 ENCOUNTER — Other Ambulatory Visit (INDEPENDENT_AMBULATORY_CARE_PROVIDER_SITE_OTHER): Payer: Self-pay | Admitting: Internal Medicine

## 2020-12-27 ENCOUNTER — Encounter (INDEPENDENT_AMBULATORY_CARE_PROVIDER_SITE_OTHER): Payer: Self-pay | Admitting: Nurse Practitioner

## 2020-12-27 ENCOUNTER — Other Ambulatory Visit: Payer: Self-pay

## 2020-12-27 ENCOUNTER — Ambulatory Visit (INDEPENDENT_AMBULATORY_CARE_PROVIDER_SITE_OTHER): Payer: Medicare Other | Admitting: Nurse Practitioner

## 2020-12-27 VITALS — BP 140/77 | HR 97 | Temp 97.9°F | Ht 66.5 in | Wt 170.2 lb

## 2020-12-27 DIAGNOSIS — R300 Dysuria: Secondary | ICD-10-CM

## 2020-12-27 DIAGNOSIS — D751 Secondary polycythemia: Secondary | ICD-10-CM

## 2020-12-27 DIAGNOSIS — E1165 Type 2 diabetes mellitus with hyperglycemia: Secondary | ICD-10-CM

## 2020-12-27 DIAGNOSIS — I10 Essential (primary) hypertension: Secondary | ICD-10-CM | POA: Diagnosis not present

## 2020-12-27 DIAGNOSIS — E559 Vitamin D deficiency, unspecified: Secondary | ICD-10-CM

## 2020-12-27 NOTE — Progress Notes (Signed)
Subjective:  Patient ID: Sheryl Mcknight, female    DOB: [birth year]  Age: 70 y.o. MRN: [id number]  CC:  Chief Complaint  Patient presents with  . Hypertension    This is just a follow up. Patient has no concerns at this time.  . Diabetes  . Other    Vitamin D deficiency, polycythemia      HPI  This patient arrives today for the above.  Hypertension: She continues on lisinopril and had hydrochlorothiazide added to her drug regimen at last office visit.  She is due to have CMP checked today.  Is tolerating both medications well.  She tells me that once she had the medication changes at her last office visit she did have a couple of dizzy spells but this has resolved.  Diabetes: She continues on glipizide and was started on Januvia at last office visit.  She does not check her blood sugars regularly but tells me she is tolerating the Januvia well.  As stated above she did mention a couple of dizzy spells but these have resolved.  Last A1c was 9.8.  Vitamin D deficiency: She continues on 5000 IUs of vitamin D3 daily, she is due to have serum check today.  Polycythemia: Last time CBC was collected in September 2021 her hemoglobin was elevated at 16.7.  She is a non-smoker.  She denies any night sweats or unexplained fevers.  She denies any unexplained weight loss.  She reports a history of asthma but denies history of COPD.  Dysuria: She tells me she continues to have some mild burning with urination and would like to have her urine rechecked today.  Past Medical History:  Diagnosis Date  . Diabetes mellitus without complication ([place])   . Hypertension   . Vitamin D deficiency disease       No family history on file.  Social History   Social History Narrative   [place] murdered 20 yrs ago.Lives with 3 sons.Retired-previously worked in [employer] .   Social History   Tobacco Use  . Smoking status: Never Smoker  . Smokeless tobacco: Never Used  Substance Use Topics   . Alcohol use: No     Current Meds  Medication Sig  . Cholecalciferol (VITAMIN D-3) 125 MCG (5000 UT) TABS Take 1 tablet by mouth daily.  . clotrimazole-betamethasone (LOTRISONE) cream Apply 1 application topically 2 (two) times daily.  Marland Kitchen glipiZIDE (GLUCOTROL) 5 MG tablet TAKE 1 TABLET BY MOUTH TWICE DAILY BEFORE A MEAL  . hydrochlorothiazide (HYDRODIURIL) 25 MG tablet Take 1 tablet (25 mg total) by mouth daily.  Marland Kitchen lisinopril (ZESTRIL) 20 MG tablet Take 1 tablet by mouth twice daily  . sitaGLIPtin (JANUVIA) 50 MG tablet Take 1 tablet (50 mg total) by mouth daily.  . [DISCONTINUED] nitrofurantoin, macrocrystal-monohydrate, (MACROBID) 100 MG capsule Take 1 capsule (100 mg total) by mouth 2 (two) times daily.    ROS:  Review of Systems  Constitutional: Negative for fever.  HENT:       (+) PND  Eyes: Negative for blurred vision.  Respiratory: Negative for shortness of breath.   Cardiovascular: Negative for chest pain.  Neurological: Negative for dizziness and headaches.     Objective:   Today's Vitals: BP 140/77   Pulse 97   Temp 97.9 F (36.6 C)   Ht 5' 6.5" (1.689 m)   Wt 170 lb 4 oz (77.2 kg)   SpO2 97%   BMI 27.07 kg/m  Vitals with BMI 12/27/2020 10/19/2020 07/27/2020  Height 5' 6.5" 5' 7"  5' 7"   Weight 170 lbs 4 oz 167 lbs 10 oz 163 lbs 6 oz  BMI 27.07 05.39 76.73  Systolic 419 379 024  Diastolic 77 097 86  Pulse 97 83 87     Physical Exam Vitals reviewed.  Constitutional:      General: She is not in acute distress.    Appearance: Normal appearance.  HENT:     Head: Normocephalic and atraumatic.  Neck:     Vascular: No carotid bruit.  Cardiovascular:     Rate and Rhythm: Normal rate and regular rhythm.     Pulses: Normal pulses.     Heart sounds: Normal heart sounds.  Pulmonary:     Effort: Pulmonary effort is normal.     Breath sounds: Normal breath sounds.  Skin:    General: Skin is warm and dry.  Neurological:     General: No focal deficit present.      Mental Status: She is alert and oriented to person, place, and time.  Psychiatric:        Mood and Affect: Mood normal.        Behavior: Behavior normal.        Judgment: Judgment normal.          Assessment and Plan   1. Uncontrolled type 2 diabetes mellitus with hyperglycemia ([place])   2. Essential hypertension   3. Dysuria   4. Polycythemia   5. Vitamin D deficiency disease      Plan: 1.  We will check A1c as well as urine for microalbuminuria today.  I did discuss that if she has any additional dizzy episodes that she should check her blood sugar as this could be a sign of hypoglycemia.  Further recommendations will be made based upon A1c. 2.  Blood pressure acceptable today, she will continue on her current medication regimen.  We will check CMP today. 3.  We will check urinalysis with reflex to culture today. 4.  We will check CBC if hemoglobin remains elevated will need to refer to hematology. 5.  We will check serum vitamin D level today.   Tests ordered Orders Placed This Encounter  Procedures  . CMP with eGFR([facility])  . Hemoglobin A1c  . CBC with Differential/Platelets  . Urinalysis with Culture Reflex  . Microalbumin/Creatinine Ratio, Urine  . Vitamin D, 25-hydroxy      No orders of the defined types were placed in this encounter.   Patient to follow-up in 3 months or sooner as needed.  Ailene Ards, NP

## 2020-12-28 ENCOUNTER — Other Ambulatory Visit (INDEPENDENT_AMBULATORY_CARE_PROVIDER_SITE_OTHER): Payer: Self-pay | Admitting: Nurse Practitioner

## 2020-12-28 DIAGNOSIS — I10 Essential (primary) hypertension: Secondary | ICD-10-CM

## 2020-12-28 DIAGNOSIS — E1165 Type 2 diabetes mellitus with hyperglycemia: Secondary | ICD-10-CM

## 2020-12-28 LAB — COMPLETE METABOLIC PANEL WITH GFR
AG Ratio: 1.1 (calc) (ref 1.0–2.5)
Glucose, Bld: 415 mg/dL — ABNORMAL HIGH (ref 65–139)

## 2020-12-28 MED ORDER — AMLODIPINE BESYLATE 5 MG PO TABS: 5.0000 mg | ORAL_TABLET | Freq: Every day | ORAL | 0 refills | Status: DC

## 2020-12-28 MED ORDER — SITAGLIPTIN PHOSPHATE 100 MG PO TABS
100.0000 mg | ORAL_TABLET | Freq: Every day | ORAL | 0 refills | Status: DC
Start: 2020-12-28 — End: 2021-04-20

## 2020-12-28 NOTE — Progress Notes (Signed)
Please call this patient and let her know that her labs showed her diabetes is way out of control.  Her A1c jumped from 9.8 up to 12.2.  For now, I am going to recommend she increase her Januvia from 50 mg daily to 100 mg daily.  Thus, instead of taking 1 tablet by mouth daily she should take 2 tablets by mouth daily of her Januvia until she runs out of her medication.  I will also send in a new, stronger prescription (100mg /tab) of Januvia.  When she picks up her new prescription she will go back to taking 1 tablet by mouth daily.   Additionally, her metabolic panel shows that her sodium has dropped.  This can be a side effect of her hydrochlorothiazide.  I would recommend she stop her hydrochlorothiazide and instead I am to start her on amlodipine 5 mg by mouth daily.  I have sent a prescription of this to the [facility] pharmacy here in [place].  Please schedule her for a follow-up with me in the next 1 to 3 weeks so I can follow her blood pressure and blood sugars closely.  If she has any questions or concerns please let me know what they are so that I can address them.  Thank you.

## 2020-12-28 NOTE — Progress Notes (Signed)
I did send her some comments regarding some of the tests in her MyChart.  However, her urine did show possible evidence of urinary tract infection.  I am awaiting for the urine culture results to determine which antibiotic she should take to treat this.  She should be able to take amlodipine and lisinopril at the same time, however if she feels unwell doing this she certainly can space them out by couple of hours.  She can also take her Januvia at the same time as her other daily medications, but as with her blood pressure medicine if she feels unwell taking the Januvia at the same time of the medication she can certainly space them out by an hour or 2.  I do not believe she needs to wait 30 minutes to eat when she takes her Januvia, in fact she should probably take her Januvia around the same time as her first meal.  Please let me know if she has any other questions.  Thank you.

## 2020-12-30 ENCOUNTER — Other Ambulatory Visit (INDEPENDENT_AMBULATORY_CARE_PROVIDER_SITE_OTHER): Payer: Self-pay | Admitting: Nurse Practitioner

## 2020-12-30 ENCOUNTER — Encounter (INDEPENDENT_AMBULATORY_CARE_PROVIDER_SITE_OTHER): Payer: Self-pay | Admitting: Nurse Practitioner

## 2020-12-30 DIAGNOSIS — N390 Urinary tract infection, site not specified: Secondary | ICD-10-CM

## 2020-12-30 LAB — CBC WITH DIFFERENTIAL/PLATELET
Absolute Monocytes: 752 cells/uL (ref 200–950)
Basophils Absolute: 122 cells/uL (ref 0–200)
Basophils Relative: 1.6 %
Eosinophils Absolute: 319 cells/uL (ref 15–500)
Eosinophils Relative: 4.2 %
HCT: 43.3 % (ref 35.0–45.0)
Hemoglobin: 14.9 g/dL (ref 11.7–15.5)
Lymphs Abs: 2979 cells/uL (ref 850–3900)
MCH: 29.9 pg (ref 27.0–33.0)
MCHC: 34.4 g/dL (ref 32.0–36.0)
MCV: 86.8 fL (ref 80.0–100.0)
MPV: 11.5 fL (ref 7.5–12.5)
Monocytes Relative: 9.9 %
Neutro Abs: 3428 cells/uL (ref 1500–7800)
Neutrophils Relative %: 45.1 %
Platelets: 218 10*3/uL (ref 140–400)
RBC: 4.99 10*6/uL (ref 3.80–5.10)
RDW: 11.8 % (ref 11.0–15.0)
Total Lymphocyte: 39.2 %
WBC: 7.6 10*3/uL (ref 3.8–10.8)

## 2020-12-30 LAB — COMPLETE METABOLIC PANEL WITH GFR
ALT: 31 U/L — ABNORMAL HIGH (ref 6–29)
AST: 26 U/L (ref 10–35)
Albumin: 4.1 g/dL (ref 3.6–5.1)
Alkaline phosphatase (APISO): 106 U/L (ref 37–153)
BUN: 24 mg/dL (ref 7–25)
CO2: 28 mmol/L (ref 20–32)
Calcium: 9.5 mg/dL (ref 8.6–10.4)
Chloride: 93 mmol/L — ABNORMAL LOW (ref 98–110)
Creat: 0.84 mg/dL (ref 0.50–0.99)
GFR, Est African American: 83 mL/min/{1.73_m2} (ref >=60)
GFR, Est Non African American: 72 mL/min/{1.73_m2} (ref >=60)
Globulin: 3.7 g/dL (calc) (ref 1.9–3.7)
Potassium: 4.1 mmol/L (ref 3.5–5.3)
Sodium: 130 mmol/L — ABNORMAL LOW (ref 135–146)
Total Bilirubin: 0.4 mg/dL (ref 0.2–1.2)
Total Protein: 7.8 g/dL (ref 6.1–8.1)

## 2020-12-30 LAB — URINALYSIS W MICROSCOPIC + REFLEX CULTURE
Bilirubin Urine: NEGATIVE
Hgb urine dipstick: NEGATIVE
Hyaline Cast: NONE SEEN /LPF
Ketones, ur: NEGATIVE
Nitrites, Initial: NEGATIVE
Protein, ur: NEGATIVE
Specific Gravity, Urine: 1.014 (ref 1.001–1.03)
Squamous Epithelial / HPF: NONE SEEN /HPF (ref <=5)
WBC, UA: >=60 /HPF — AB (ref 0–5)
pH: 5.5 (ref 5.0–8.0)

## 2020-12-30 LAB — VITAMIN D 25 HYDROXY (VIT D DEFICIENCY, FRACTURES): Vit D, 25-Hydroxy: 56 ng/mL (ref 30–100)

## 2020-12-30 LAB — HEMOGLOBIN A1C
Hgb A1c MFr Bld: 12.2 % of total Hgb — ABNORMAL HIGH (ref <5.7)
Mean Plasma Glucose: 303 mg/dL
eAG (mmol/L): 16.8 mmol/L

## 2020-12-30 LAB — MICROALBUMIN / CREATININE URINE RATIO
Creatinine, Urine: 22 mg/dL (ref 20–275)
Microalb Creat Ratio: 36 mcg/mg creat — ABNORMAL HIGH (ref <30)
Microalb, Ur: 0.8 mg/dL

## 2020-12-30 LAB — URINE CULTURE

## 2020-12-30 LAB — CULTURE INDICATED

## 2020-12-30 MED ORDER — CIPROFLOXACIN HCL 250 MG PO TABS: 250.0000 mg | ORAL_TABLET | Freq: Two times a day (BID) | ORAL | 0 refills | Status: DC

## 2021-01-01 ENCOUNTER — Telehealth (INDEPENDENT_AMBULATORY_CARE_PROVIDER_SITE_OTHER): Payer: Self-pay | Admitting: Nurse Practitioner

## 2021-01-01 ENCOUNTER — Telehealth (INDEPENDENT_AMBULATORY_CARE_PROVIDER_SITE_OTHER): Payer: Self-pay

## 2021-01-01 NOTE — Telephone Encounter (Signed)
It looks like Maralyn Sago has already seen this result and is dealing with it.  Thanks for letting me know though.

## 2021-01-01 NOTE — Telephone Encounter (Signed)
Called patient and gave her the message. Patient stated that she received her medicine last night and started taking it. She verbalized an understanding and stated that she did not have any questions at the moment but will contact us if she does or if she has any problems. Sending as Sheryl Mcknight.

## 2021-01-01 NOTE — Telephone Encounter (Signed)
Please call this patient to verify that she is aware that I have sent in prescription for ciprofloxacin 250mg  by mouth twice a day for 5 days. This is for treatment of UTI. I have already sent the medication into the pharmacy, but I was unable to get ahold of her over the weekend so I just want to make sure she got the message and is starting the medication. She should also be made aware of black box warnings associated with this medication such as risk of tendon rupture (if she feels any joint pain then she should stop the medication) and c. Diff infection (if she starts to experience diarrhea and abdominal pain she should call our office). Please let me know is she has any questions. Thank you.

## 2021-01-01 NOTE — Telephone Encounter (Signed)
[facility] called and left a detailed voice message for an urgent lab results: reference number [id number]  Called back and spoke to [facility] and she stated that the patients Glucose level on 12/28/2019 was 415.  Sending as FYI in case has not been seen.

## 2021-01-18 ENCOUNTER — Ambulatory Visit (INDEPENDENT_AMBULATORY_CARE_PROVIDER_SITE_OTHER): Payer: Medicare Other | Admitting: Nurse Practitioner

## 2021-01-22 ENCOUNTER — Other Ambulatory Visit (INDEPENDENT_AMBULATORY_CARE_PROVIDER_SITE_OTHER): Payer: Self-pay | Admitting: Internal Medicine

## 2021-01-30 ENCOUNTER — Ambulatory Visit (INDEPENDENT_AMBULATORY_CARE_PROVIDER_SITE_OTHER): Payer: Medicare Other | Admitting: Nurse Practitioner

## 2021-01-30 ENCOUNTER — Other Ambulatory Visit: Payer: Self-pay

## 2021-01-30 ENCOUNTER — Encounter (INDEPENDENT_AMBULATORY_CARE_PROVIDER_SITE_OTHER): Payer: Self-pay | Admitting: Nurse Practitioner

## 2021-01-30 NOTE — Progress Notes (Signed)
Patient was scheduled for annual wellness visit today, but stated that she would prefer not to have this visit completed today and would rather reschedule.  She was encouraged to check out at the front and have her annual Medicare wellness visit rescheduled.

## 2021-02-01 ENCOUNTER — Telehealth (INDEPENDENT_AMBULATORY_CARE_PROVIDER_SITE_OTHER): Payer: Self-pay

## 2021-02-01 NOTE — Telephone Encounter (Signed)
Called patient and gave her the message. I scheduled patient for this Monday at 3:45pm. Patient verbalized an understanding.

## 2021-02-01 NOTE — Telephone Encounter (Signed)
She will need to be seen in the office for further evaluation to determine whether we need to trial a change in her medications or if we need to do additional workout. Schedule her for a visit at the next available appointment time whether it is with me or Dr. Karilyn Cota.

## 2021-02-01 NOTE — Telephone Encounter (Signed)
Patient called and stated that her ankles have started swelling and she noticed a little over a week ago and this has never happened per patient. Patient thinks it is the Amlodipine that is causing swelling. Please advise.

## 2021-02-05 ENCOUNTER — Other Ambulatory Visit: Payer: Self-pay

## 2021-02-05 ENCOUNTER — Ambulatory Visit (INDEPENDENT_AMBULATORY_CARE_PROVIDER_SITE_OTHER): Payer: Medicare Other | Admitting: Internal Medicine

## 2021-02-05 ENCOUNTER — Encounter (INDEPENDENT_AMBULATORY_CARE_PROVIDER_SITE_OTHER): Payer: Self-pay | Admitting: Internal Medicine

## 2021-02-05 VITALS — BP 140/80 | HR 92 | Temp 98.0°F | Ht 64.0 in | Wt 174.4 lb

## 2021-02-05 DIAGNOSIS — I10 Essential (primary) hypertension: Secondary | ICD-10-CM

## 2021-02-05 DIAGNOSIS — N390 Urinary tract infection, site not specified: Secondary | ICD-10-CM

## 2021-02-05 DIAGNOSIS — E1165 Type 2 diabetes mellitus with hyperglycemia: Secondary | ICD-10-CM | POA: Diagnosis not present

## 2021-02-05 NOTE — Progress Notes (Signed)
Metrics: Intervention Frequency ACO  Documented Smoking Status Yearly  Screened one or more times in 24 months  Cessation Counseling or  Active cessation medication Past 24 months  Past 24 months   Guideline developer: UpToDate (See UpToDate for funding source) Date Released: 2014       Wellness Office Visit  Subjective:  Patient ID: Sheryl Mcknight, female    DOB: Jul 19, 1951  Age: 70 y.o. MRN: [id number]  CC: This lady had made an appointment for ankle swelling but this seems to have resolved.  She was recently treated for urinary tract infection and her diabetes was uncontrolled and Januvia dose was increased. HPI  She says that the swelling has improved.  She does take amlodipine and wonders whether this could cause it and I told her it certainly could. She apparently got itching with ciprofloxacin but she may have taken enough to help the urine infection. Past Medical History:  Diagnosis Date  . Diabetes mellitus without complication (HCC)   . Hypertension   . Vitamin D deficiency disease    Past Surgical History:  Procedure Laterality Date  . CESAREAN SECTION       History reviewed. No pertinent family history.  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   Social History   Tobacco Use  . Smoking status: Never Smoker  . Smokeless tobacco: Never Used  Substance Use Topics  . Alcohol use: No    Current Meds  Medication Sig  . amLODipine (NORVASC) 5 MG tablet Take 1 tablet (5 mg total) by mouth daily.  . Cholecalciferol (VITAMIN D-3) 125 MCG (5000 UT) TABS Take 1 tablet by mouth daily.  . clotrimazole-betamethasone (LOTRISONE) cream Apply 1 application topically 2 (two) times daily.  Marland Kitchen lisinopril (ZESTRIL) 20 MG tablet Take 1 tablet by mouth twice daily  . psyllium (METAMUCIL) 58.6 % powder Take 1 packet by mouth 3 (three) times daily.  . sitaGLIPtin (JANUVIA) 100 MG tablet  Take 1 tablet (100 mg total) by mouth daily.     Flowsheet Row Office Visit from 01/30/2021 in [facility]  PHQ-9 Total Score 0      Objective:   Today's Vitals: BP 140/80   Pulse 92   Temp 98 F (36.7 C) (Temporal)   Ht 5\' 4"  (1.626 m)   Wt 174 lb 6.4 oz (79.1 kg)   SpO2 97%   BMI 29.94 kg/m  Vitals with BMI 02/05/2021 01/30/2021 12/27/2020  Height 5\' 4"  5\' 4"  5' 6.5"  Weight 174 lbs 6 oz 172 lbs 3 oz 170 lbs 4 oz  BMI 29.92 29.54 27.07  Systolic 140 142 12/29/2020  Diastolic 80 82 77  Pulse 92 66 97     Physical Exam Her blood pressure repeated today in the office is more reasonable than when she first came to the office.  I have recorded the corrected blood pressure reading today.  She does not appear to have any significant swelling in her lower legs today.      Assessment   1. Uncontrolled type 2 diabetes mellitus with hyperglycemia (HCC)   2. Essential hypertension   3. Urinary tract infection without hematuria, site unspecified       Tests ordered Orders Placed This Encounter  Procedures  . Urinalysis w microscopic + reflex cultur  . COMPLETE METABOLIC PANEL WITH GFR     Plan: 1. Continue with higher dose of Januvia. 2. We discussed  intermittent fasting and previously she had only tried it for 2-3 consecutive days.  I told her that if she can fast for 16 hours every single day, I think she will start to get better with her diabetes. 3. Repeat urinalysis and culture to make sure the urine infection is cleared. 4. Check complete metabolic panel to make sure renal function is okay as well as sodium levels and glucose levels. 5. Follow-up as previously scheduled in the middle of May.   No orders of the defined types were placed in this encounter.   Wilson Singer, MD

## 2021-02-06 LAB — URINALYSIS W MICROSCOPIC + REFLEX CULTURE
Bacteria, UA: NONE SEEN /HPF
Bilirubin Urine: NEGATIVE
Hgb urine dipstick: NEGATIVE
Hyaline Cast: NONE SEEN /LPF
Ketones, ur: NEGATIVE
Leukocyte Esterase: NEGATIVE
Nitrites, Initial: NEGATIVE
Protein, ur: NEGATIVE
RBC / HPF: NONE SEEN /HPF (ref 0–2)
Specific Gravity, Urine: 1.008 (ref 1.001–1.03)
Squamous Epithelial / HPF: NONE SEEN /HPF (ref <=5)
WBC, UA: NONE SEEN /HPF (ref 0–5)
pH: 6.5 (ref 5.0–8.0)

## 2021-02-06 LAB — COMPLETE METABOLIC PANEL WITH GFR
AG Ratio: 1.1 (calc) (ref 1.0–2.5)
ALT: 21 U/L (ref 6–29)
AST: 20 U/L (ref 10–35)
Albumin: 4.2 g/dL (ref 3.6–5.1)
Alkaline phosphatase (APISO): 100 U/L (ref 37–153)
BUN: 15 mg/dL (ref 7–25)
CO2: 29 mmol/L (ref 20–32)
Calcium: 9.1 mg/dL (ref 8.6–10.4)
Chloride: 99 mmol/L (ref 98–110)
Creat: 0.64 mg/dL (ref 0.50–0.99)
GFR, Est African American: 106 mL/min/{1.73_m2} (ref >=60)
GFR, Est Non African American: 92 mL/min/{1.73_m2} (ref >=60)
Globulin: 3.7 g/dL (calc) (ref 1.9–3.7)
Glucose, Bld: 304 mg/dL — ABNORMAL HIGH (ref 65–139)
Potassium: 3.6 mmol/L (ref 3.5–5.3)
Sodium: 136 mmol/L (ref 135–146)
Total Bilirubin: 0.4 mg/dL (ref 0.2–1.2)
Total Protein: 7.9 g/dL (ref 6.1–8.1)

## 2021-02-06 LAB — NO CULTURE INDICATED

## 2021-02-12 ENCOUNTER — Telehealth (INDEPENDENT_AMBULATORY_CARE_PROVIDER_SITE_OTHER): Payer: Self-pay

## 2021-02-12 NOTE — Telephone Encounter (Signed)
Patient called and left a detailed voice message to know her lab results.  I called patient back and gave her the message from Dr. Karilyn Cota. Patient verbalized an understanding and thanked me for calling her back.

## 2021-02-22 ENCOUNTER — Other Ambulatory Visit (INDEPENDENT_AMBULATORY_CARE_PROVIDER_SITE_OTHER): Payer: Self-pay | Admitting: Internal Medicine

## 2021-03-13 ENCOUNTER — Telehealth (INDEPENDENT_AMBULATORY_CARE_PROVIDER_SITE_OTHER): Payer: Self-pay

## 2021-03-13 ENCOUNTER — Other Ambulatory Visit (INDEPENDENT_AMBULATORY_CARE_PROVIDER_SITE_OTHER): Payer: Self-pay | Admitting: Internal Medicine

## 2021-03-13 MED ORDER — GLIPIZIDE 5 MG PO TABS
5.0000 mg | ORAL_TABLET | Freq: Two times a day (BID) | ORAL | 1 refills | Status: DC
Start: 2021-03-13 — End: 2021-11-27

## 2021-03-13 NOTE — Telephone Encounter (Signed)
Received a refill request from [facility] pharmacy for the following medication:  glipiZIDE (GLUCOTROL) 5 MG tablet  Last filled 12/14/2020, # 60 with 3 refills  Last OV 02/05/2021  Next OV 03/26/2021 Next OV 04/18/2021

## 2021-03-26 ENCOUNTER — Ambulatory Visit (INDEPENDENT_AMBULATORY_CARE_PROVIDER_SITE_OTHER): Payer: Medicare Other | Admitting: Internal Medicine

## 2021-03-29 ENCOUNTER — Other Ambulatory Visit (INDEPENDENT_AMBULATORY_CARE_PROVIDER_SITE_OTHER): Payer: Self-pay | Admitting: Nurse Practitioner

## 2021-03-29 DIAGNOSIS — I10 Essential (primary) hypertension: Secondary | ICD-10-CM

## 2021-04-18 ENCOUNTER — Ambulatory Visit (INDEPENDENT_AMBULATORY_CARE_PROVIDER_SITE_OTHER): Payer: Medicare Other | Admitting: Nurse Practitioner

## 2021-04-20 ENCOUNTER — Other Ambulatory Visit (INDEPENDENT_AMBULATORY_CARE_PROVIDER_SITE_OTHER): Payer: Self-pay | Admitting: Nurse Practitioner

## 2021-04-20 DIAGNOSIS — E1165 Type 2 diabetes mellitus with hyperglycemia: Secondary | ICD-10-CM

## 2021-04-23 ENCOUNTER — Ambulatory Visit (INDEPENDENT_AMBULATORY_CARE_PROVIDER_SITE_OTHER): Payer: Medicare Other | Admitting: Internal Medicine

## 2021-04-26 ENCOUNTER — Emergency Department (HOSPITAL_COMMUNITY): Payer: Medicare Other

## 2021-04-26 ENCOUNTER — Encounter (HOSPITAL_COMMUNITY): Payer: Self-pay | Admitting: Emergency Medicine

## 2021-04-26 ENCOUNTER — Other Ambulatory Visit: Payer: Self-pay

## 2021-04-26 ENCOUNTER — Emergency Department (HOSPITAL_COMMUNITY)
Admission: EM | Admit: 2021-04-26 | Discharge: 2021-04-26 | Disposition: A | Discharge status: Home / Self Care | Payer: Medicare Other | Attending: Emergency Medicine | Admitting: Emergency Medicine

## 2021-04-26 DIAGNOSIS — Z79899 Other long term (current) drug therapy: Secondary | ICD-10-CM | POA: Insufficient documentation

## 2021-04-26 DIAGNOSIS — E119 Type 2 diabetes mellitus without complications: Secondary | ICD-10-CM | POA: Diagnosis not present

## 2021-04-26 DIAGNOSIS — M25562 Pain in left knee: Secondary | ICD-10-CM | POA: Diagnosis not present

## 2021-04-26 DIAGNOSIS — Z7984 Long term (current) use of oral hypoglycemic drugs: Secondary | ICD-10-CM | POA: Diagnosis not present

## 2021-04-26 DIAGNOSIS — I10 Essential (primary) hypertension: Secondary | ICD-10-CM | POA: Insufficient documentation

## 2021-04-26 DIAGNOSIS — R0781 Pleurodynia: Secondary | ICD-10-CM | POA: Diagnosis not present

## 2021-04-26 DIAGNOSIS — S299XXA Unspecified injury of thorax, initial encounter: Secondary | ICD-10-CM | POA: Diagnosis not present

## 2021-04-26 DIAGNOSIS — W010XXA Fall on same level from slipping, tripping and stumbling without subsequent striking against object, initial encounter: Secondary | ICD-10-CM | POA: Insufficient documentation

## 2021-04-26 DIAGNOSIS — W19XXXA Unspecified fall, initial encounter: Secondary | ICD-10-CM

## 2021-04-26 DIAGNOSIS — I700 Atherosclerosis of aorta: Secondary | ICD-10-CM | POA: Diagnosis not present

## 2021-04-26 MED ORDER — ACETAMINOPHEN 500 MG PO TABS: 1000.0000 mg | ORAL_TABLET | Freq: Once | ORAL | Status: DC

## 2021-04-26 MED ORDER — FENTANYL CITRATE (PF) 100 MCG/2ML IJ SOLN
50.0000 ug | Freq: Once | INTRAMUSCULAR | Status: DC
  Filled 2021-04-26: qty 2

## 2021-04-26 NOTE — ED Notes (Signed)
Pt to radiology.

## 2021-04-26 NOTE — ED Triage Notes (Signed)
Pt states she tripped over her shoe and fell on the left side. Pt c/o left rib and left knee pain since fall.

## 2021-04-26 NOTE — ED Provider Notes (Signed)
[facility] EMERGENCY DEPARTMENT Provider Note   CSN: [id number] Arrival date & time: 04/26/21  0010     History Chief Complaint  Patient presents with   Sheryl Mcknight    Sheryl Mcknight is a 70 y.o. female.   Fall This is a new problem. The current episode started less than 1 hour ago. The problem occurs constantly. The problem has not changed since onset.Pertinent negatives include no chest pain, no abdominal pain, no headaches and no shortness of breath. Nothing aggravates the symptoms. Nothing relieves the symptoms. She has tried nothing for the symptoms. The treatment provided no relief.      Past Medical History:  Diagnosis Date   Diabetes mellitus without complication (HCC)    Hypertension    Vitamin D deficiency disease     Patient Active Problem List   Diagnosis Date Noted   Hypertension    Diabetes mellitus without complication (HCC)    Vitamin D deficiency disease    Sprain of foot 07/01/2013    Past Surgical History:  Procedure Laterality Date   CESAREAN SECTION       OB History   No obstetric history on file.     No family history on file.  Social History   Tobacco Use   Smoking status: Never   Smokeless tobacco: Never  Vaping Use   Vaping Use: Never used  Substance Use Topics   Alcohol use: No   Drug use: No    Frequency: 3.0 times per week    Home Medications Prior to Admission medications   Medication Sig Start Date End Date Taking? Authorizing Provider  amLODipine (NORVASC) 5 MG tablet Take 1 tablet by mouth once daily 03/29/21 06/27/21  Lilly Cove C, MD  Cholecalciferol (VITAMIN D-3) 125 MCG (5000 UT) TABS Take 1 tablet by mouth daily.    [provider]  clotrimazole-betamethasone (LOTRISONE) cream Apply 1 application topically 2 (two) times daily. 07/27/20   Elenore Paddy, NP  glipiZIDE (GLUCOTROL) 5 MG tablet Take 1 tablet (5 mg total) by mouth 2 (two) times daily before a meal. 03/13/21 04/12/21  Wilson Singer, MD  JANUVIA 100  MG tablet Take 1 tablet by mouth once daily 04/20/21   Lilly Cove C, MD  lisinopril (ZESTRIL) 20 MG tablet Take 1 tablet by mouth twice daily 02/22/21   Elenore Paddy, NP  psyllium (METAMUCIL) 58.6 % powder Take 1 packet by mouth 3 (three) times daily.    [provider]    Allergies    Penicillins, Sulfa antibiotics, and Ciprofloxacin  Review of Systems   Review of Systems  Respiratory:  Negative for shortness of breath.   Cardiovascular:  Negative for chest pain.  Gastrointestinal:  Negative for abdominal pain.  Musculoskeletal:        Left knee pain and left rib pain  Neurological:  Negative for headaches.  All other systems reviewed and are negative.  Physical Exam Updated Vital Signs BP (!) 188/87   Pulse (!) 103   Temp 98.2 F (36.8 C) (Oral)   Resp 18   Ht 5\' 7"  (1.702 m)   Wt 77.1 kg   SpO2 96%   BMI 26.63 kg/m   Physical Exam Vitals and nursing note reviewed.  Constitutional:      Appearance: She is well-developed.  HENT:     Head: Normocephalic and atraumatic.     Nose: Nose normal. No congestion or rhinorrhea.     Mouth/Throat:     Mouth: Mucous  membranes are moist.     Pharynx: Oropharynx is clear.  Eyes:     Pupils: Pupils are equal, round, and reactive to light.  Cardiovascular:     Rate and Rhythm: Normal rate and regular rhythm.  Pulmonary:     Effort: No respiratory distress.     Breath sounds: No stridor.  Abdominal:     General: There is no distension.  Musculoskeletal:        General: Tenderness (left lateral knee and left rib. no obvious effusion, deformity or other injury.) present. No swelling. Normal range of motion.     Cervical back: Normal range of motion.  Skin:    General: Skin is warm and dry.  Neurological:     General: No focal deficit present.     Mental Status: She is alert.    ED Results / Procedures / Treatments   Labs (all labs ordered are listed, but only abnormal results are displayed) Labs Reviewed - No  data to display  EKG None  Radiology DG Ribs Unilateral W/Chest Left  Result Date: 04/26/2021 CLINICAL DATA:  Trip and fall injury.  Left rib pain EXAM: LEFT RIBS AND CHEST - 3+ VIEW COMPARISON:  04/14/2005 FINDINGS: Heart size and pulmonary vascularity are normal. Lungs are clear. No pleural effusions. No pneumothorax. Mediastinal contours appear intact. Calcification of the aorta. Degenerative changes in the spine and shoulders. Left ribs appear intact. No acute displaced fractures or focal bone lesions identified. Soft tissues are unremarkable. IMPRESSION: No evidence of active pulmonary disease.  Negative left ribs. Electronically Signed   By: Burman Nieves M.D.   On: 04/26/2021 01:18   DG Knee Complete 4 Views Left  Result Date: 04/26/2021 CLINICAL DATA:  Trip and fall injury.  Left knee pain. EXAM: LEFT KNEE - COMPLETE 4+ VIEW COMPARISON:  None. FINDINGS: No evidence of fracture, dislocation, or joint effusion. No evidence of arthropathy or other focal bone abnormality. Soft tissues are unremarkable. IMPRESSION: Negative. Electronically Signed   By: Burman Nieves M.D.   On: 04/26/2021 01:16    Procedures Procedures   Medications Ordered in ED Medications  acetaminophen (TYLENOL) tablet 1,000 mg (has no administration in time range)    ED Course  I have reviewed the triage vital signs and the nursing notes.  Pertinent labs & imaging results that were available during my care of the patient were reviewed by me and considered in my medical decision making (see chart for details).    MDM Rules/Calculators/A&P                          Negative xr's. Supportive care at home. RICE.  Final Clinical Impression(s) / ED Diagnoses Final diagnoses:  Fall, initial encounter  Acute pain of left knee  Rib pain on left side    Rx / DC Orders ED Discharge Orders     None        Nicko Daher, Barbara Cower, MD 04/26/21 [phone]

## 2021-05-23 ENCOUNTER — Other Ambulatory Visit (INDEPENDENT_AMBULATORY_CARE_PROVIDER_SITE_OTHER): Payer: Self-pay | Admitting: Nurse Practitioner

## 2021-05-29 ENCOUNTER — Ambulatory Visit (INDEPENDENT_AMBULATORY_CARE_PROVIDER_SITE_OTHER): Payer: Medicare Other | Admitting: Nurse Practitioner

## 2021-05-29 ENCOUNTER — Encounter (INDEPENDENT_AMBULATORY_CARE_PROVIDER_SITE_OTHER): Payer: Self-pay | Admitting: Nurse Practitioner

## 2021-05-29 ENCOUNTER — Other Ambulatory Visit: Payer: Self-pay

## 2021-05-29 VITALS — BP 152/84 | HR 105 | Temp 98.2°F | Ht 64.5 in | Wt 174.6 lb

## 2021-05-29 DIAGNOSIS — Z0000 Encounter for general adult medical examination without abnormal findings: Secondary | ICD-10-CM | POA: Diagnosis not present

## 2021-05-29 NOTE — Progress Notes (Signed)
Subjective:   CASSONDRA STACHOWSKI is a 70 y.o. female who presents for Medicare Annual (Subsequent) preventive examination.  Review of Systems     Cardiac Risk Factors include: advanced age (>51men, >56 women);diabetes mellitus;hypertension     Objective:    Today's Vitals   05/29/21 1307  BP: (!) 152/84  Pulse: (!) 105  Temp: 98.2 F (36.8 C)  TempSrc: Temporal  SpO2: 96%  Weight: 174 lb 9.6 oz (79.2 kg)  Height: 5' 4.5" (1.638 m)   Body mass index is 29.51 kg/m.  Advanced Directives 05/29/2021 04/26/2021  Does Patient Have a Medical Advance Directive? No No  Would patient like information on creating a medical advance directive? No - Patient declined No - Patient declined    Current Medications (verified) Outpatient Encounter Medications as of 05/29/2021  Medication Sig   amLODipine (NORVASC) 5 MG tablet Take 1 tablet by mouth once daily   Cholecalciferol (VITAMIN D-3) 125 MCG (5000 UT) TABS Take 1 tablet by mouth daily.   clotrimazole-betamethasone (LOTRISONE) cream Apply 1 application topically 2 (two) times daily.   JANUVIA 100 MG tablet Take 1 tablet by mouth once daily   lisinopril (ZESTRIL) 20 MG tablet Take 1 tablet by mouth twice daily   psyllium (METAMUCIL) 58.6 % powder Take 1 packet by mouth 3 (three) times daily.   glipiZIDE (GLUCOTROL) 5 MG tablet Take 1 tablet (5 mg total) by mouth 2 (two) times daily before a meal.   No facility-administered encounter medications on file as of 05/29/2021.    Allergies (verified) Penicillins, Sulfa antibiotics, and Ciprofloxacin   History: Past Medical History:  Diagnosis Date   Diabetes mellitus without complication (HCC)    Hypertension    Vitamin D deficiency disease    Past Surgical History:  Procedure Laterality Date   CESAREAN SECTION     History reviewed. No pertinent family history. Social History   Socioeconomic History   Marital status: Widowed    Spouse name: Not on file   Number of children: Not on  file   Years of education: Not on file   Highest education level: Not on file  Occupational History   Not on file  Tobacco Use   Smoking status: Never   Smokeless tobacco: Never  Vaping Use   Vaping Use: Never used  Substance and Sexual Activity   Alcohol use: No   Drug use: No    Frequency: 3.0 times per week   Sexual activity: Not on file  Other Topics Concern   Not on file  Social History Narrative   Widow,husband murdered 20 yrs ago.Lives with 3 sons.Retired-previously worked in [employer] .   Social Determinants of Health   Financial Resource Strain: Not on file  Food Insecurity: Not on file  Transportation Needs: Not on file  Physical Activity: Not on file  Stress: Not on file  Social Connections: Not on file    Tobacco Counseling Counseling given: Yes   Clinical Intake:  Pre-visit preparation completed: Yes  Pain : No/denies pain     BMI - recorded: 29.51 Nutritional Status: BMI 25 -29 Overweight Nutritional Risks: None Diabetes: Yes CBG done?: No Did pt. bring in CBG monitor from home?: No  How often do you need to have someone help you when you read instructions, pamphlets, or other written materials from your doctor or pharmacy?: 1 - Never What is the last grade level you completed in school?: 11th grade  Diabetic? YES  Interpreter Needed?: No  Information entered  by :: Jiles Prows, NP-C   Activities of Daily Living In your present state of health, do you have any difficulty performing the following activities: 05/29/2021  Hearing? N  Vision? N  Difficulty concentrating or making decisions? N  Walking or climbing stairs? N  Dressing or bathing? N  Doing errands, shopping? N  Preparing Food and eating ? N  Using the Toilet? N  In the past six months, have you accidently leaked urine? N  Do you have problems with loss of bowel control? N  Managing your Medications? N  Managing your Finances? N  Housekeeping or managing your Housekeeping? N   Some recent data might be hidden    Patient Care Team: Wilson Singer, MD as PCP - General (Internal Medicine)  Indicate any recent Medical Services you may have received from other than [facility] providers in the past year (date may be approximate).     Assessment:   This is a routine wellness examination for Hally.  Hearing/Vision screen No results found.  Dietary issues and exercise activities discussed: Current Exercise Habits: Home exercise routine, Type of exercise: calisthenics, Time (Minutes): 30, Frequency (Times/Week): 5, Weekly Exercise (Minutes/Week): 150, Intensity: Moderate, Exercise limited by: None identified   Goals Addressed   None    Depression Screen PHQ 2/9 Scores 05/29/2021 01/30/2021 07/27/2020 02/24/2020  PHQ - 2 Score 0 0 1 0  PHQ- 9 Score 0 0 - -    Fall Risk Fall Risk  05/29/2021 01/30/2021 02/24/2020  Falls in the past year? 1 0 1  Comment - - gotr roll & kick out her bed by baby.  Number falls in past yr: 0 - 0  Injury with Fall? 1 - 1  Comment - - stated tailbone is painful.  Risk for fall due to : - - Other (Comment)  Follow up - - Falls evaluation completed    FALL RISK PREVENTION PERTAINING TO THE HOME:  Any stairs in or around the home? Yes  If so, are there any without handrails? No  Home free of loose throw rugs in walkways, pet beds, electrical cords, etc? Yes  Adequate lighting in your home to reduce risk of falls? Yes   ASSISTIVE DEVICES UTILIZED TO PREVENT FALLS:  Life alert? No  Use of a cane, walker or w/c? No  Grab bars in the bathroom? No  Shower chair or bench in shower? Yes  Elevated toilet seat or a handicapped toilet? No   TIMED UP AND GO:  Was the test performed? Yes .  Length of time to ambulate 10 feet: 7 sec.   Gait steady and fast without use of assistive device  Cognitive Function:     6CIT Screen 05/29/2021  What Year? 0 points  What month? 0 points  What time? 0 points  Count back from 20 0 points   Months in reverse 0 points  Repeat phrase 2 points  Total Score 2    Immunizations Immunization History  Administered Date(s) Administered   Fluad Quad(high Dose 65+) 10/14/2019, 10/19/2020   Influenza Inj Mdck Quad With Preservative 08/26/2018   Moderna Sars-Covid-2 Vaccination 07/16/2020, 08/13/2020   Pneumococcal Conjugate-13 08/26/2018   Pneumococcal Polysaccharide-23 10/14/2019    TDAP status: Due, Education has been provided regarding the importance of this vaccine. Advised may receive this vaccine at local pharmacy or Health Dept. Aware to provide a copy of the vaccination record if obtained from local pharmacy or Health Dept. Verbalized acceptance and understanding.  Flu Vaccine status:  Up to date  Pneumococcal vaccine status: Up to date  Covid-19 vaccine status: Completed vaccines  Qualifies for Shingles Vaccine? Yes   Zostavax completed No   Shingrix Completed?: No.    Education has been provided regarding the importance of this vaccine. Patient has been advised to call insurance company to determine out of pocket expense if they have not yet received this vaccine. Advised may also receive vaccine at local pharmacy or Health Dept. Verbalized acceptance and understanding.  Screening Tests Health Maintenance  Topic Date Due   OPHTHALMOLOGY EXAM  Never done   Zoster Vaccines- Shingrix (1 of 2) Never done   COVID-19 Vaccine (3 - Booster for Moderna series) 01/11/2021   MAMMOGRAM  07/27/2021 (Originally 02/04/2003)   DEXA SCAN  07/27/2021 (Originally 02/03/2018)   COLONOSCOPY (Pts 45-12yrs Insurance coverage will need to be confirmed)  07/27/2021 (Originally 02/03/1998)   TETANUS/TDAP  07/27/2021 (Originally 02/04/1972)   Hepatitis C Screening  07/27/2021 (Originally 02/04/1971)   INFLUENZA VACCINE  06/11/2021   HEMOGLOBIN A1C  06/26/2021   FOOT EXAM  07/27/2021   PNA vac Low Risk Adult  Completed   HPV VACCINES  Aged Out    Health Maintenance  Health Maintenance Due   Topic Date Due   OPHTHALMOLOGY EXAM  Never done   Zoster Vaccines- Shingrix (1 of 2) Never done   COVID-19 Vaccine (3 - Booster for Moderna series) 01/11/2021   Colon Cancer: patient prefers to hold off for not  Mammogram: patient prefers to hold off for not  DEXA: patient prefers to hold off for not  Lung Cancer Screening: (Low Dose CT Chest recommended if Age 79-80 years, 30 pack-year currently smoking OR have quit w/in 15years.) does not qualify.   Lung Cancer Screening Referral: N/A  Additional Screening:  Hepatitis C Screening:  qualify Yes; Completed does not want this at this time  Vision Screening: Recommended annual ophthalmology exams for early detection of glaucoma and other disorders of the eye. Is the patient up to date with their annual eye exam?  No  Who is the provider or what is the name of the office in which the patient attends annual eye exams? Patient is not sure of the name of provider but has an appointment coming up If pt is not established with a provider, would they like to be referred to a provider to establish care? No .   Dental Screening: Recommended annual dental exams for proper oral hygiene  Community Resource Referral / Chronic Care Management: CRR required this visit?  No   CCM required this visit?  No      Plan:  Patient is up-to-date with routine screenings that she is willing to undergo at this time.  I did encourage her to let me know if she would like to do colon cancer screening, breast cancer screening, bone density testing, and hepatitis C screening.  She is due for tetanus vaccine and will consider this in the near future she is due for COVID-19 booster and will also consider this.  He is also due for shingles vaccine will consider this.  Of note, she did mention that she has a rash that we had discussed previously and I have prescribed her some cream which she tells me did not really help her symptoms.  She tells me the rash comes and  goes and is itchy.  I did encourage her to consider seeing dermatology but for now she does not want to be referred.  She was encouraged  to try over-the-counter creams such as hydrocortisone as needed for itching, Benadryl cream as needed for itching, calamine lotion, and/or CeraVe lotion.  She tells me she let me know if she decides to get referral to dermatology.  I have personally reviewed and noted the following in the patient's chart:   Medical and social history Use of alcohol, tobacco or illicit drugs  Current medications and supplements including opioid prescriptions.  Functional ability and status Nutritional status Physical activity Advanced directives List of other physicians Hospitalizations, surgeries, and ER visits in previous 12 months Vitals Screenings to include cognitive, depression, and falls Referrals and appointments  In addition, I have reviewed and discussed with patient certain preventive protocols, quality metrics, and best practice recommendations. A written personalized care plan for preventive services as well as general preventive health recommendations were provided to patient.     Elenore PaddySARAH E Anushri Casalino, NP   05/29/2021

## 2021-05-29 NOTE — Patient Instructions (Signed)
  Sheryl Mcknight , Thank you for taking time to come for your Medicare Wellness Visit. I appreciate your ongoing commitment to your health goals. Please review the following plan we discussed and let me know if I can assist you in the future.   These are the goals we discussed:  Goals   None     This is a list of the screening recommended for you and due dates:  Health Maintenance  Topic Date Due   Eye exam for diabetics  Never done   Zoster (Shingles) Vaccine (1 of 2) Never done   COVID-19 Vaccine (3 - Booster for Moderna series) 01/11/2021   Mammogram  07/27/2021*   DEXA scan (bone density measurement)  07/27/2021*   Colon Cancer Screening  07/27/2021*   Tetanus Vaccine  07/27/2021*   Hepatitis C Screening: USPSTF Recommendation to screen - Ages 18-79 yo.  07/27/2021*   Flu Shot  06/11/2021   Hemoglobin A1C  06/26/2021   Complete foot exam   07/27/2021   Pneumonia vaccines  Completed   HPV Vaccine  Aged Out  *Topic was postponed. The date shown is not the original due date.

## 2021-06-04 ENCOUNTER — Ambulatory Visit (INDEPENDENT_AMBULATORY_CARE_PROVIDER_SITE_OTHER): Payer: Medicare Other | Admitting: Internal Medicine

## 2021-06-25 ENCOUNTER — Ambulatory Visit (INDEPENDENT_AMBULATORY_CARE_PROVIDER_SITE_OTHER): Payer: Medicare Other | Admitting: Internal Medicine

## 2021-06-25 ENCOUNTER — Encounter (INDEPENDENT_AMBULATORY_CARE_PROVIDER_SITE_OTHER): Payer: Self-pay

## 2021-11-27 ENCOUNTER — Encounter: Payer: Self-pay | Admitting: Emergency Medicine

## 2021-11-27 ENCOUNTER — Ambulatory Visit
Admission: EM | Admit: 2021-11-27 | Discharge: 2021-11-27 | Disposition: A | Discharge status: Home / Self Care | Payer: Medicare HMO | Attending: Student | Admitting: Student

## 2021-11-27 ENCOUNTER — Other Ambulatory Visit: Payer: Self-pay

## 2021-11-27 DIAGNOSIS — Z7984 Long term (current) use of oral hypoglycemic drugs: Secondary | ICD-10-CM | POA: Diagnosis not present

## 2021-11-27 DIAGNOSIS — Z889 Allergy status to unspecified drugs, medicaments and biological substances status: Secondary | ICD-10-CM

## 2021-11-27 DIAGNOSIS — N3001 Acute cystitis with hematuria: Secondary | ICD-10-CM

## 2021-11-27 DIAGNOSIS — E1159 Type 2 diabetes mellitus with other circulatory complications: Secondary | ICD-10-CM | POA: Diagnosis not present

## 2021-11-27 DIAGNOSIS — Z760 Encounter for issue of repeat prescription: Secondary | ICD-10-CM

## 2021-11-27 DIAGNOSIS — I10 Essential (primary) hypertension: Secondary | ICD-10-CM | POA: Diagnosis not present

## 2021-11-27 DIAGNOSIS — E119 Type 2 diabetes mellitus without complications: Secondary | ICD-10-CM

## 2021-11-27 DIAGNOSIS — E1169 Type 2 diabetes mellitus with other specified complication: Secondary | ICD-10-CM

## 2021-11-27 DIAGNOSIS — B3731 Acute candidiasis of vulva and vagina: Secondary | ICD-10-CM | POA: Diagnosis not present

## 2021-11-27 LAB — POCT URINALYSIS DIP (MANUAL ENTRY)
Bilirubin, UA: NEGATIVE
Glucose, UA: >=1000 mg/dL — AB
Ketones, POC UA: NEGATIVE mg/dL
Leukocytes, UA: NEGATIVE
Nitrite, UA: POSITIVE — AB
Protein Ur, POC: 100 mg/dL — AB
Spec Grav, UA: 1.01 (ref 1.010–1.025)
Urobilinogen, UA: 1 E.U./dL
pH, UA: 5 (ref 5.0–8.0)

## 2021-11-27 MED ORDER — LISINOPRIL 20 MG PO TABS: 20.0000 mg | ORAL_TABLET | Freq: Two times a day (BID) | ORAL | 1 refills | Status: DC

## 2021-11-27 MED ORDER — AMLODIPINE BESYLATE 5 MG PO TABS: 5.0000 mg | ORAL_TABLET | Freq: Two times a day (BID) | ORAL | 1 refills | Status: DC

## 2021-11-27 MED ORDER — FLUCONAZOLE 150 MG PO TABS: 150.0000 mg | ORAL_TABLET | Freq: Every day | ORAL | 0 refills | Status: DC

## 2021-11-27 MED ORDER — NITROFURANTOIN MONOHYD MACRO 100 MG PO CAPS: 100.0000 mg | ORAL_CAPSULE | Freq: Two times a day (BID) | ORAL | 0 refills | Status: DC

## 2021-11-27 MED ORDER — SITAGLIPTIN PHOSPHATE 100 MG PO TABS: 100.0000 mg | ORAL_TABLET | Freq: Every day | ORAL | 1 refills | Status: DC

## 2021-11-27 MED ORDER — GLIPIZIDE 5 MG PO TABS: 5.0000 mg | ORAL_TABLET | Freq: Two times a day (BID) | ORAL | 1 refills | Status: DC

## 2021-11-27 NOTE — ED Provider Notes (Signed)
[facility]    CSN: [id number] Arrival date & time: 11/27/21  1753      History   Chief Complaint No chief complaint on file.   HPI Sheryl Mcknight is a 71 y.o. female presenting for medication refill and urinary symptoms. Medical history diabetes, hypertension. Describes dysuria and urinary frequency x5 days. Has attempted azo for relief. Denies hematuria,  urgency, back pain, n/v/d/abd pain, fevers/chills, abdnormal vaginal discharge. Has taken her chronic medications for years - not out of them but not taking daily to make them last. Denies headaches, vision changes, CP, SOB. Also with vaginal itching for few days. Denies vaginal odor or discharge. Does not monitor sugars at home.   HPI  Past Medical History:  Diagnosis Date   Diabetes mellitus without complication ([place])    Hypertension    Vitamin D deficiency disease     Patient Active Problem List   Diagnosis Date Noted   Hypertension    Diabetes mellitus without complication ([place])    Vitamin D deficiency disease    Sprain of foot 07/01/2013    Past Surgical History:  Procedure Laterality Date   CESAREAN SECTION      OB History   No obstetric history on file.      Home Medications    Prior to Admission medications   Medication Sig Start Date End Date Taking? Authorizing Provider  amLODipine (NORVASC) 5 MG tablet Take 1 tablet (5 mg total) by mouth 2 (two) times daily. 11/27/21 12/27/21 Yes Hazel Sams, PA-C  fluconazole (DIFLUCAN) 150 MG tablet Take 1 tablet (150 mg total) by mouth daily. -For your yeast infection, start the Diflucan (fluconazole)- Take one pill today (day 1). If you're still having symptoms in 3 days, take the second pill. 11/27/21  Yes Hazel Sams, PA-C  glipiZIDE (GLUCOTROL) 5 MG tablet Take 1 tablet (5 mg total) by mouth 2 (two) times daily. 11/27/21 12/27/21 Yes Hazel Sams, PA-C  lisinopril (ZESTRIL) 20 MG tablet Take 1 tablet (20 mg total) by mouth 2 (two) times daily.  11/27/21 12/27/21 Yes Hazel Sams, PA-C  nitrofurantoin, macrocrystal-monohydrate, (MACROBID) 100 MG capsule Take 1 capsule (100 mg total) by mouth 2 (two) times daily. 11/27/21  Yes Hazel Sams, PA-C  sitaGLIPtin (JANUVIA) 100 MG tablet Take 1 tablet (100 mg total) by mouth daily. 11/27/21 12/27/21 Yes Hazel Sams, PA-C  Cholecalciferol (VITAMIN D-3) 125 MCG (5000 UT) TABS Take 1 tablet by mouth daily.    [provider]  clotrimazole-betamethasone (LOTRISONE) cream Apply 1 application topically 2 (two) times daily. 07/27/20   Ailene Ards, NP  psyllium (METAMUCIL) 58.6 % powder Take 1 packet by mouth 3 (three) times daily.    [provider]    Family History History reviewed. No pertinent family history.  Social History Social History   Tobacco Use   Smoking status: Never   Smokeless tobacco: Never  Vaping Use   Vaping Use: Never used  Substance Use Topics   Alcohol use: No   Drug use: No    Frequency: 3.0 times per week     Allergies   Penicillins, Sulfa antibiotics, and Ciprofloxacin   Review of Systems Review of Systems  Constitutional:  Negative for appetite change, chills, diaphoresis and fever.  Respiratory:  Negative for shortness of breath.   Cardiovascular:  Negative for chest pain.  Gastrointestinal:  Negative for abdominal pain, blood in stool, constipation, diarrhea, nausea and vomiting.  Genitourinary:  Positive for  dysuria and frequency. Negative for decreased urine volume, difficulty urinating, flank pain, genital sores, hematuria and urgency.  Musculoskeletal:  Negative for back pain.  Neurological:  Negative for dizziness, weakness and light-headedness.  All other systems reviewed and are negative.   Physical Exam Triage Vital Signs ED Triage Vitals  Enc Vitals Group     BP 11/27/21 1801 (!) 178/96     Pulse Rate 11/27/21 1801 (!) 105     Resp 11/27/21 1801 18     Temp 11/27/21 1801 98.6 F (37 C)     Temp Source 11/27/21  1801 Oral     SpO2 11/27/21 1801 92 %     Weight --      Height --      Head Circumference --      Peak Flow --      Pain Score 11/27/21 1804 6     Pain Loc --      Pain Edu? --      Excl. in [place]? --    No data found.  Updated Vital Signs BP (!) 178/96 (BP Location: Right Arm)    Pulse (!) 105    Temp 98.6 F (37 C) (Oral)    Resp 18    SpO2 92%   Visual Acuity Right Eye Distance:   Left Eye Distance:   Bilateral Distance:    Right Eye Near:   Left Eye Near:    Bilateral Near:     Physical Exam Vitals reviewed.  Constitutional:      General: She is not in acute distress.    Appearance: Normal appearance. She is not ill-appearing.  HENT:     Head: Normocephalic and atraumatic.     Mouth/Throat:     Mouth: Mucous membranes are moist.     Comments: Moist mucous membranes Eyes:     Extraocular Movements: Extraocular movements intact.     Pupils: Pupils are equal, round, and reactive to light.  Cardiovascular:     Rate and Rhythm: Normal rate and regular rhythm.     Heart sounds: Normal heart sounds.  Pulmonary:     Effort: Pulmonary effort is normal.     Breath sounds: Normal breath sounds. No wheezing, rhonchi or rales.  Abdominal:     General: Bowel sounds are normal. There is no distension.     Palpations: Abdomen is soft. There is no mass.     Tenderness: There is no abdominal tenderness. There is no right CVA tenderness, left CVA tenderness, guarding or rebound.  Genitourinary:    Comments: deferred Skin:    General: Skin is warm.     Capillary Refill: Capillary refill takes less than 2 seconds.     Comments: Good skin turgor  Neurological:     General: No focal deficit present.     Mental Status: She is alert and oriented to person, place, and time.  Psychiatric:        Mood and Affect: Mood normal.        Behavior: Behavior normal.     UC Treatments / Results  Labs (all labs ordered are listed, but only abnormal results are displayed) Labs Reviewed   POCT URINALYSIS DIP (MANUAL ENTRY) - Abnormal; Notable for the following components:      Result Value   Color, UA orange (*)    Glucose, UA >=1,000 (*)    Blood, UA large (*)    Protein Ur, POC =100 (*)    Nitrite, UA Positive (*)  All other components within normal limits    EKG   Radiology No results found.  Procedures Procedures (including critical care time)  Medications Ordered in UC Medications - No data to display  Initial Impression / Assessment and Plan / UC Course  I have reviewed the triage vital signs and the nursing notes.  Pertinent labs & imaging results that were available during my care of the patient were reviewed by me and considered in my medical decision making (see chart for details).     This patient is a very pleasant 71 y.o. year old female presenting with UTI, vaginal candidiasis and medication refill (hypertension, diabetes). Borderline tachy but afebrile, no reproducible abd pain or CVAT.  For diabetes- does not monitor sugars at home. Denies n/v/d, dizziness, weakness.   UA with glucose, large blood, positive nitrite. Patient has taken azo throughout her illness but not today. Culture sent.  She is penicillin, sulfa, fluoroquinolone allergic.  Macrobid sent as below.  Has not run out of medications but is rationing them to avoid running out. Refilled: lisinopril 20mg  BID, glipizide 5mg  BID, januvia 100mg  daily, amlodipine 5mg  bid. Confirmed dosage with her multiple times. She has tolerated this well for years.   Diflucan sent for vaginal candidiasis.    Establish care with new PCP as scheduled in 12/2021.   ED return precautions discussed. Patient verbalizes understanding and agreement.   Coding Level 4 for acute illness with systemic symptoms, and prescription drug management   Final Clinical Impressions(s) / UC Diagnoses   Final diagnoses:  Essential hypertension  Type 2 diabetes mellitus with other specified complication, without  long-term current use of insulin ([place])  Diabetes mellitus treated with oral medication ([place])  Medication refill  Acute cystitis with hematuria  Vaginal candidiasis  Multiple drug allergies     Discharge Instructions      -Continue the: lisinopril 20mg  twice daily, glipizide 5mg  twice daily, januvia 100mg  daily, amlodipine 5mg  twice daily. -For your yeast infection, start the Diflucan (fluconazole)- Take one pill today (day 1). If you're still having symptoms in 3 days, take the second pill.  -Macrobid twice daily x5 days  -Please check your blood pressure at home or at the pharmacy. If this continues to be >140/90, follow-up with your primary care provider for further blood pressure management/ medication titration. If you develop chest pain, shortness of breath, vision changes, the worst headache of your life- head straight to the ED or call 911. -Follow-up with new PCP as scheduled in february     ED Prescriptions     Medication Sig Dispense Auth. Provider   lisinopril (ZESTRIL) 20 MG tablet Take 1 tablet (20 mg total) by mouth 2 (two) times daily. 60 tablet Hazel Sams, PA-C   fluconazole (DIFLUCAN) 150 MG tablet Take 1 tablet (150 mg total) by mouth daily. -For your yeast infection, start the Diflucan (fluconazole)- Take one pill today (day 1). If you're still having symptoms in 3 days, take the second pill. 2 tablet Hazel Sams, PA-C   glipiZIDE (GLUCOTROL) 5 MG tablet Take 1 tablet (5 mg total) by mouth 2 (two) times daily. 60 tablet Hazel Sams, PA-C   sitaGLIPtin (JANUVIA) 100 MG tablet Take 1 tablet (100 mg total) by mouth daily. 30 tablet Hazel Sams, PA-C   amLODipine (NORVASC) 5 MG tablet Take 1 tablet (5 mg total) by mouth 2 (two) times daily. 60 tablet Hazel Sams, PA-C   nitrofurantoin, macrocrystal-monohydrate, (MACROBID) 100 MG capsule Take  1 capsule (100 mg total) by mouth 2 (two) times daily. 10 capsule Hazel Sams, PA-C      PDMP not  reviewed this encounter.   Hazel Sams, PA-C 11/27/21 1837

## 2021-11-27 NOTE — Discharge Instructions (Addendum)
-  Continue the: lisinopril 20mg  twice daily, glipizide 5mg  twice daily, januvia 100mg  daily, amlodipine 5mg  twice daily. -For your yeast infection, start the Diflucan (fluconazole)- Take one pill today (day 1). If you're still having symptoms in 3 days, take the second pill.  -Macrobid twice daily x5 days  -Please check your blood pressure at home or at the pharmacy. If this continues to be >140/90, follow-up with your primary care provider for further blood pressure management/ medication titration. If you develop chest pain, shortness of breath, vision changes, the worst headache of your life- head straight to the ED or call 911. -Follow-up with new PCP as scheduled in february

## 2021-11-27 NOTE — ED Triage Notes (Signed)
Unable to see new PCP until end of February.  Needs a refill on lisinopril  20mg  BID, glipizide 5mg  BID, januvia 100mg  daily, amlodipine 5mg  daily.    Pressure on urination, and frequency.  Pain on urination since Friday.  Has tried AZO for relief.

## 2022-01-04 ENCOUNTER — Ambulatory Visit (INDEPENDENT_AMBULATORY_CARE_PROVIDER_SITE_OTHER): Payer: Medicare HMO | Admitting: Nurse Practitioner

## 2022-01-04 ENCOUNTER — Other Ambulatory Visit: Payer: Medicare HMO

## 2022-01-04 ENCOUNTER — Other Ambulatory Visit: Payer: Self-pay

## 2022-01-04 ENCOUNTER — Telehealth: Payer: Self-pay | Admitting: Nurse Practitioner

## 2022-01-04 ENCOUNTER — Other Ambulatory Visit: Payer: Self-pay | Admitting: Nurse Practitioner

## 2022-01-04 VITALS — BP 158/92 | HR 85 | Temp 98.1°F | Ht 64.5 in | Wt 176.4 lb

## 2022-01-04 DIAGNOSIS — R300 Dysuria: Secondary | ICD-10-CM

## 2022-01-04 DIAGNOSIS — E119 Type 2 diabetes mellitus without complications: Secondary | ICD-10-CM

## 2022-01-04 DIAGNOSIS — I10 Essential (primary) hypertension: Secondary | ICD-10-CM | POA: Diagnosis not present

## 2022-01-04 DIAGNOSIS — E782 Mixed hyperlipidemia: Secondary | ICD-10-CM | POA: Diagnosis not present

## 2022-01-04 DIAGNOSIS — Z23 Encounter for immunization: Secondary | ICD-10-CM

## 2022-01-04 DIAGNOSIS — B379 Candidiasis, unspecified: Secondary | ICD-10-CM

## 2022-01-04 DIAGNOSIS — E559 Vitamin D deficiency, unspecified: Secondary | ICD-10-CM

## 2022-01-04 DIAGNOSIS — E1165 Type 2 diabetes mellitus with hyperglycemia: Secondary | ICD-10-CM

## 2022-01-04 LAB — COMPREHENSIVE METABOLIC PANEL
ALT: 38 U/L — ABNORMAL HIGH (ref 0–35)
AST: 30 U/L (ref 0–37)
Albumin: 4.1 g/dL (ref 3.5–5.2)
Alkaline Phosphatase: 117 U/L (ref 39–117)
BUN: 17 mg/dL (ref 6–23)
CO2: 34 mEq/L — ABNORMAL HIGH (ref 19–32)
Calcium: 9.5 mg/dL (ref 8.4–10.5)
Chloride: 96 mEq/L (ref 96–112)
Creatinine, Ser: 0.74 mg/dL (ref 0.40–1.20)
GFR: 82.84 mL/min (ref >60.00)
Glucose, Bld: 404 mg/dL — ABNORMAL HIGH (ref 70–99)
Potassium: 3.7 mEq/L (ref 3.5–5.1)
Sodium: 133 mEq/L — ABNORMAL LOW (ref 135–145)
Total Bilirubin: 0.4 mg/dL (ref 0.2–1.2)
Total Protein: 8.5 g/dL — ABNORMAL HIGH (ref 6.0–8.3)

## 2022-01-04 LAB — CBC WITH DIFFERENTIAL/PLATELET
Basophils Absolute: 0.1 10*3/uL (ref 0.0–0.1)
Basophils Relative: 1.4 % (ref 0.0–3.0)
Eosinophils Absolute: 0.3 10*3/uL (ref 0.0–0.7)
Eosinophils Relative: 4.2 % (ref 0.0–5.0)
HCT: 45.6 % (ref 36.0–46.0)
Hemoglobin: 15.2 g/dL — ABNORMAL HIGH (ref 12.0–15.0)
Lymphocytes Relative: 36.3 % (ref 12.0–46.0)
Lymphs Abs: 2.9 10*3/uL (ref 0.7–4.0)
MCHC: 33.4 g/dL (ref 30.0–36.0)
MCV: 87.8 fl (ref 78.0–100.0)
Monocytes Absolute: 0.7 10*3/uL (ref 0.1–1.0)
Monocytes Relative: 8.8 % (ref 3.0–12.0)
Neutro Abs: 3.9 10*3/uL (ref 1.4–7.7)
Neutrophils Relative %: 49.3 % (ref 43.0–77.0)
Platelets: 200 10*3/uL (ref 150.0–400.0)
RBC: 5.19 Mil/uL — ABNORMAL HIGH (ref 3.87–5.11)
RDW: 13 % (ref 11.5–15.5)
WBC: 7.9 10*3/uL (ref 4.0–10.5)

## 2022-01-04 LAB — URINALYSIS WITH CULTURE, IF INDICATED
Bilirubin Urine: NEGATIVE
Hgb urine dipstick: NEGATIVE
Ketones, ur: NEGATIVE
Leukocytes,Ua: NEGATIVE
Nitrite: NEGATIVE
Specific Gravity, Urine: 1.01 (ref 1.000–1.030)
Total Protein, Urine: NEGATIVE
Urine Glucose: >=1000 — AB
Urobilinogen, UA: 0.2 (ref 0.0–1.0)
pH: 5.5 (ref 5.0–8.0)

## 2022-01-04 LAB — LIPID PANEL
Cholesterol: 136 mg/dL (ref 0–200)
HDL: 42.6 mg/dL (ref >39.00)
NonHDL: 93.69
Total CHOL/HDL Ratio: 3
Triglycerides: 235 mg/dL — ABNORMAL HIGH (ref 0.0–149.0)
VLDL: 47 mg/dL — ABNORMAL HIGH (ref 0.0–40.0)

## 2022-01-04 LAB — MICROALBUMIN / CREATININE URINE RATIO
Creatinine,U: 20.3 mg/dL
Microalb Creat Ratio: 45.1 mg/g — ABNORMAL HIGH (ref 0.0–30.0)
Microalb, Ur: 9.2 mg/dL — ABNORMAL HIGH (ref 0.0–1.9)

## 2022-01-04 LAB — LDL CHOLESTEROL, DIRECT: Direct LDL: 68 mg/dL

## 2022-01-04 LAB — TSH: TSH: 0.71 u[IU]/mL (ref 0.35–5.50)

## 2022-01-04 LAB — VITAMIN D 25 HYDROXY (VIT D DEFICIENCY, FRACTURES): VITD: 52.12 ng/mL (ref 30.00–100.00)

## 2022-01-04 MED ORDER — AMLODIPINE BESYLATE 10 MG PO TABS: 10.0000 mg | ORAL_TABLET | Freq: Every day | ORAL | 1 refills | Status: DC

## 2022-01-04 MED ORDER — METFORMIN HCL 500 MG PO TABS: 500.0000 mg | ORAL_TABLET | Freq: Two times a day (BID) | ORAL | 3 refills | Status: DC

## 2022-01-04 MED ORDER — NYSTATIN 100000 UNIT/GM EX CREA: 1.0000 "application " | TOPICAL_CREAM | Freq: Two times a day (BID) | CUTANEOUS | 0 refills | Status: AC

## 2022-01-04 NOTE — Progress Notes (Signed)
Subjective:  Patient ID: Sheryl Mcknight, female    DOB: [birth year]  Age: 71 y.o. MRN: [id number]  CC:  Chief Complaint  Patient presents with   Transitions Of Care      HPI  This patient arrives today for the above.  She was a patient of mine at my previous office.  She has not been seen by a primary care provider since that office closed.  She comes today to reestablish care.    She tells me that she has been experiencing a lot of stress and has had some recent illnesses since last time I saw her, however most of these have resolved.    She has diabetes and reports that her blood sugars have been running high.  She is on glipizide and Januvia.  She tells me that she has been having yeast infections of her skin as well as vaginal yeast infections intermittently.  She is not interested in any injectable medication options for treatment of her diabetes at this time.  She tells me that she has been on metformin in the past and tolerated it fairly well and would consider restarting this if necessary.    She has hypertension and elevated cholesterol with elevated triglycerides.  She is due to have lipid panel collected.  She is on amlodipine 5 mg by mouth daily (in the chart it is listed as 5 mg by mouth twice a day, however she tells me she takes it once a day) and lisinopril 20 mg by mouth twice a day.  She also has a question regarding regarding immunizations and when she is due for.  Per chart review it appears that she is due for tetanus, shingles, and flu vaccines.   Past Medical History:  Diagnosis Date   Diabetes mellitus without complication ([place])    Hypertension    Vitamin D deficiency disease       No family history on file.  Social History   Social History Narrative   [place] murdered 20 yrs ago.Lives with 3 sons.Retired-previously worked in [employer] .   Social History   Tobacco Use   Smoking status: Never   Smokeless tobacco: Never  Substance Use Topics    Alcohol use: No     Current Meds  Medication Sig   Cholecalciferol (VITAMIN D-3) 125 MCG (5000 UT) TABS Take 1 tablet by mouth daily.   fluconazole (DIFLUCAN) 150 MG tablet Take 1 tablet (150 mg total) by mouth daily. -For your yeast infection, start the Diflucan (fluconazole)- Take one pill today (day 1). If you're still having symptoms in 3 days, take the second pill.   glipiZIDE (GLUCOTROL) 5 MG tablet Take 1 tablet (5 mg total) by mouth 2 (two) times daily.   lisinopril (ZESTRIL) 20 MG tablet Take 1 tablet (20 mg total) by mouth 2 (two) times daily.   nystatin cream (MYCOSTATIN) Apply 1 application topically 2 (two) times daily.   sitaGLIPtin (JANUVIA) 100 MG tablet Take 1 tablet (100 mg total) by mouth daily.   [DISCONTINUED] amLODipine (NORVASC) 5 MG tablet Take 1 tablet (5 mg total) by mouth 2 (two) times daily.   [DISCONTINUED] nitrofurantoin, macrocrystal-monohydrate, (MACROBID) 100 MG capsule Take 1 capsule (100 mg total) by mouth 2 (two) times daily.    ROS:  Review of Systems  Constitutional:  Negative for fever.  Eyes:  Negative for blurred vision.  Respiratory:  Negative for shortness of breath.   Cardiovascular:  Negative for chest pain.  Genitourinary:  Positive for dysuria (intermittent).  Skin:  Positive for rash.  Neurological:  Negative for dizziness and headaches.    Objective:   Today's Vitals: BP (!) 158/92 (BP Location: Right Arm, Patient Position: Sitting, Cuff Size: Large)    Pulse 85    Temp 98.1 F (36.7 C) (Oral)    Ht 5' 4.5" (1.638 m)    Wt 176 lb 6.4 oz (80 kg)    SpO2 97%    BMI 29.81 kg/m  Vitals with BMI 01/04/2022 11/27/2021 05/29/2021  Height 5' 4.5" - 5' 4.5"  Weight 176 lbs 6 oz - 174 lbs 10 oz  BMI [id number] - [id number]  Systolic 0000000 0000000 0000000  Diastolic 92 96 84  Pulse 85 105 105     Physical Exam Vitals reviewed.  Constitutional:      General: She is not in acute distress.    Appearance: Normal appearance.  HENT:     Head: Normocephalic  and atraumatic.  Neck:     Vascular: No carotid bruit.  Cardiovascular:     Rate and Rhythm: Normal rate and regular rhythm.     Pulses: Normal pulses.     Heart sounds: Normal heart sounds.  Pulmonary:     Effort: Pulmonary effort is normal.     Breath sounds: Normal breath sounds.  Skin:    General: Skin is warm and dry.  Neurological:     General: No focal deficit present.     Mental Status: She is alert and oriented to person, place, and time.  Psychiatric:        Mood and Affect: Mood normal.        Behavior: Behavior normal.        Judgment: Judgment normal.         Assessment and Plan   1. Primary hypertension   2. Diabetes mellitus without complication ([place])   3. Vitamin D deficiency disease   4. Elevated triglycerides with high cholesterol   5. Yeast infection   6. Dysuria   7. Need for influenza vaccination      Plan: 1.  Blood pressure uncontrolled on current regimen.  Will increase amlodipine to 10 mg daily.  She will continue on lisinopril.  We will follow-up in 1 month for close monitoring.  She was encouraged to let us know if she has any side effects from increased dose of amlodipine. 2.  We will check metabolic panel as well as A1c today.  I anticipate restarting her on metformin but will hold off until I get blood work results.  She will continue on glipizide and Januvia for now, we did discuss possibly discontinuing Januvia due to her yeast infections.  For now she like to stay on it but we may need to revisit this in the near future. 3.  We will check vitamin D level. 4.  We will check lipid panel today. 5.  Nystatin cream prescribed so she can take as needed for rash. 6.  She does mention she has intermittent dysuria and would like to have her urine tested for UTI.  Will order urinalysis with reflex to culture if needed, further recommendations will be made based upon those results. 7.  We will administer flu shot today.  She was encouraged to discuss  getting tetanus shot and shingles vaccinations completed to her pharmacy.   Tests ordered Orders Placed This Encounter  Procedures   Flu vaccine HIGH DOSE PF (Fluzone High dose)   TSH   Hemoglobin A1c  Lipid panel   Comprehensive metabolic panel   CBC with Differential/Platelet   Vitamin D (25 hydroxy)   Microalbumin / creatinine urine ratio   Urinalysis with Culture, if indicated      Meds ordered this encounter  Medications   nystatin cream (MYCOSTATIN)    Sig: Apply 1 application topically 2 (two) times daily.    Dispense:  30 g    Refill:  0    Order Specific Question:   Supervising Provider    Answer:   BURNS, Claudina Lick [id number]   amLODipine (NORVASC) 10 MG tablet    Sig: Take 1 tablet (10 mg total) by mouth daily.    Dispense:  60 tablet    Refill:  1    Order Specific Question:   Supervising Provider    Answer:   Binnie Rail [id number]    Patient to follow-up in 1 month or sooner as needed.  Ailene Ards, NP

## 2022-01-04 NOTE — Telephone Encounter (Signed)
When you come into the office next week, please call this patient and let her know that her blood work showed elevated blood sugar.  I am going to start her on metformin 500 mg by mouth twice a day.  She will take this in addition to the medication she is already taking for her diabetes.  She needs to start checking her blood sugar on her glucometer every morning before her first meal and keep a record of this.  She is to bring this with her to her next office visit.  Please tell her to contact the office if her blood sugar readings are 300 or higher despite starting the metformin.  I attempted to call her with this information but she did not answer either of her phone numbers and I was unable to leave a voicemail.

## 2022-01-05 LAB — URINE CULTURE
MICRO NUMBER:: 13054524
SPECIMEN QUALITY:: ADEQUATE

## 2022-01-05 LAB — HEMOGLOBIN A1C
Hgb A1c MFr Bld: 12.2 % of total Hgb — ABNORMAL HIGH (ref <5.7)
Mean Plasma Glucose: 303 mg/dL
eAG (mmol/L): 16.8 mmol/L

## 2022-01-07 ENCOUNTER — Telehealth: Payer: Self-pay | Admitting: Nurse Practitioner

## 2022-01-07 NOTE — Telephone Encounter (Signed)
When you have a moment, will you call this patient and see if we can reschedule her for an earlier appointment with me sometime next Thursday or Friday (01/17/22 or 01/18/22)? Thank you

## 2022-01-08 NOTE — Telephone Encounter (Signed)
I was able to speak with the pt has inform her of Sela Hilding. Instructions. Pt states she understands and will start to do as she was told.

## 2022-01-10 ENCOUNTER — Telehealth: Payer: Self-pay | Admitting: Nurse Practitioner

## 2022-01-10 NOTE — Telephone Encounter (Signed)
Patient notified of lab results

## 2022-01-10 NOTE — Telephone Encounter (Signed)
When you have a moment will you read the comments below and call the patient to tell her these comments? I had sent the comments via mychart, but I don't know that she knows her password so she may not be able to access the comments. Also, please see if you can schedule her with me next week. I would like to follow-up with her sooner than currently scheduled. Thank you. ? ?"Your A1C is very high. I did order metformin to be added to your medication regimen. Take 1 tablet by mouth twice a day. Please start checking your blood sugar every morning before your first meal. Keep a log of this and bring with you to your next appointment. I am going to try and get you scheduled for a sooner appointment. Someone from the staff should call to schedule you." ?

## 2022-01-29 ENCOUNTER — Telehealth: Payer: Self-pay | Admitting: Nurse Practitioner

## 2022-01-29 ENCOUNTER — Telehealth: Payer: Self-pay

## 2022-01-29 DIAGNOSIS — I10 Essential (primary) hypertension: Secondary | ICD-10-CM

## 2022-01-29 DIAGNOSIS — E1165 Type 2 diabetes mellitus with hyperglycemia: Secondary | ICD-10-CM

## 2022-01-29 MED ORDER — SITAGLIPTIN PHOSPHATE 100 MG PO TABS: 100.0000 mg | ORAL_TABLET | Freq: Every day | ORAL | 1 refills | Status: DC

## 2022-01-29 MED ORDER — METFORMIN HCL 500 MG PO TABS: 500.0000 mg | ORAL_TABLET | Freq: Two times a day (BID) | ORAL | 1 refills | Status: DC

## 2022-01-29 MED ORDER — LISINOPRIL 20 MG PO TABS: 20.0000 mg | ORAL_TABLET | Freq: Two times a day (BID) | ORAL | 1 refills | Status: DC

## 2022-01-29 NOTE — Telephone Encounter (Signed)
1.Medication Requested: sitaGLIPtin (JANUVIA) 100 MG tablet ?glipiZIDE (GLUCOTROL) 5 MG tablet ?lisinopril (ZESTRIL) 20 MG tablet ? ? ?2. Pharmacy (Name, Street, [place]): [id number] - [place], [place] - [address]  ?Phone:  [phone] ?Fax:  [phone] ? ? ?3. On Med List: yes ? ?4. Last Visit with PCP: 02.24.2023 ? ?5. Next visit date with PCP: 04.14.23 ? ? ?Agent: Please be advised that RX refills may take up to 3 business days. We ask that you follow-up with your pharmacy.  ?

## 2022-01-29 NOTE — Telephone Encounter (Signed)
Rx sent 

## 2022-01-29 NOTE — Telephone Encounter (Signed)
Pts son called requesting a refill for pt's Metformin and lisinopril. He states the last time lisinopril was filled by ED and pt is now completely out. I informed pt I do see the pt has refills of her metformin but her son stated he just called and they said they didn't have anything on record and pt has been out since Sunday. ? ?Resent Metformin rx & sent rx for lisinopril  ?

## 2022-01-30 MED ORDER — GLIPIZIDE 5 MG PO TABS: 5.0000 mg | ORAL_TABLET | Freq: Two times a day (BID) | ORAL | 2 refills | Status: DC

## 2022-01-30 NOTE — Telephone Encounter (Signed)
Rx sent 

## 2022-01-30 NOTE — Addendum Note (Signed)
Addended by: Marinus Maw on: 01/30/2022 02:07 PM ? ? Modules accepted: Orders ? ?

## 2022-01-30 NOTE — Telephone Encounter (Signed)
Pts son requesting a refill for glipiZIDE (GLUCOTROL) 5 MG tablet (Expired) ? ?Son is not on dpr, no pt information was released  ?

## 2022-02-01 ENCOUNTER — Ambulatory Visit: Payer: Medicare HMO | Admitting: Nurse Practitioner

## 2022-02-22 ENCOUNTER — Ambulatory Visit (INDEPENDENT_AMBULATORY_CARE_PROVIDER_SITE_OTHER): Payer: Medicare HMO | Admitting: Nurse Practitioner

## 2022-02-22 ENCOUNTER — Other Ambulatory Visit: Payer: Self-pay | Admitting: Nurse Practitioner

## 2022-02-22 VITALS — BP 170/84 | HR 85 | Temp 98.1°F | Ht 64.5 in | Wt 174.0 lb

## 2022-02-22 DIAGNOSIS — H6691 Otitis media, unspecified, right ear: Secondary | ICD-10-CM | POA: Diagnosis not present

## 2022-02-22 DIAGNOSIS — I10 Essential (primary) hypertension: Secondary | ICD-10-CM | POA: Diagnosis not present

## 2022-02-22 DIAGNOSIS — E1165 Type 2 diabetes mellitus with hyperglycemia: Secondary | ICD-10-CM

## 2022-02-22 MED ORDER — METFORMIN HCL ER 500 MG PO TB24: 500.0000 mg | ORAL_TABLET | Freq: Two times a day (BID) | ORAL | 1 refills | Status: DC

## 2022-02-22 MED ORDER — GLIPIZIDE 5 MG PO TABS: 5.0000 mg | ORAL_TABLET | Freq: Two times a day (BID) | ORAL | 1 refills | Status: DC

## 2022-02-22 MED ORDER — DOXYCYCLINE HYCLATE 100 MG PO TABS: 100.0000 mg | ORAL_TABLET | Freq: Two times a day (BID) | ORAL | 0 refills | Status: DC

## 2022-02-22 MED ORDER — AMLODIPINE BESYLATE 10 MG PO TABS: 10.0000 mg | ORAL_TABLET | Freq: Every day | ORAL | 1 refills | Status: DC

## 2022-02-22 MED ORDER — OLMESARTAN MEDOXOMIL 20 MG PO TABS: 20.0000 mg | ORAL_TABLET | Freq: Every day | ORAL | 1 refills | Status: DC

## 2022-02-22 MED ORDER — SITAGLIPTIN PHOSPHATE 100 MG PO TABS: 100.0000 mg | ORAL_TABLET | Freq: Every day | ORAL | 1 refills | Status: DC

## 2022-02-22 NOTE — Assessment & Plan Note (Addendum)
Chronic, stable, improved.  At home blood sugars seem improved, she will continue on her metformin, glipizide, and Januvia.  As stated under plan list for hypertension will change from lisinopril to olmesartan.  She is not currently on statin therapy.  May consider this in the future. ?

## 2022-02-22 NOTE — Assessment & Plan Note (Signed)
Acute, prescription for doxycycline 1 tablet by mouth twice a day sent to patient's pharmacy as patient has allergy to penicillins.  Patient told to follow-up if symptoms persist or do not improve. ?

## 2022-02-22 NOTE — Assessment & Plan Note (Signed)
Chronic, uncontrolled, asymptomatic.  Patient will continue on amlodipine 10 mg by mouth daily, we will discontinue lisinopril, and start her on olmesartan 20 mg by mouth daily.  She will follow-up in 2 weeks for close monitoring as well as for repeat metabolic panel.  She is able to monitor blood pressure at home and she is willing to do so.  She was encouraged to bring list of at home blood pressure readings to next office visit. ?

## 2022-02-22 NOTE — Patient Instructions (Signed)
130/80 or better; if blood pressure is 150+/90+ then call me before next appointment for  ?

## 2022-02-22 NOTE — Progress Notes (Signed)
? ? ? ?Subjective:  ?Patient ID: Sheryl Mcknight, female    DOB: 10-09-1951  Age: 71 y.o. MRN: [id number] ? ?CC:  ?Chief Complaint  ?Patient presents with  ? Follow-up  ?  medication  ? ear concerns  ?  Soreness in right ear  ?  ? ? ?HPI  ?This patient arrives today for the above. ? ?Right ear pain: Ongoing for the last couple of days.  No significant hearing loss or discharge. ? ?Hypertension: Amlodipine increased to 10 mg a mouth daily at last office visit.  She was also on lisinopril 20 mg by mouth twice a day and she has continued on that.  She seems to be tolerating medication well.  She does not report any at home blood pressure checks today. ? ?Type 2 diabetes: Metformin restarted after A1c came back greater than 12 from last office visit.  She is tolerating metformin very well she does have some GI upset with intermittent loose stools.  Since restarting the metformin she reports that her blood sugars have dropped from the 400s down to the 100s-200s and she is feeling better.  She continues also on her Januvia and is tolerating this well. ? ?Past Medical History:  ?Diagnosis Date  ? Diabetes mellitus without complication (HCC)   ? Hypertension   ? Vitamin D deficiency disease   ? ? ? ? ?No family history on file. ? ?Social History  ? ?Social History Narrative  ? Widow,husband murdered 20 yrs ago.Lives with 3 sons.Retired-previously worked in [employer] .  ? ?Social History  ? ?Tobacco Use  ? Smoking status: Never  ? Smokeless tobacco: Never  ?Substance Use Topics  ? Alcohol use: No  ? ? ? ?Current Meds  ?Medication Sig  ? Cholecalciferol (VITAMIN D-3) 125 MCG (5000 UT) TABS Take 1 tablet by mouth daily.  ? doxycycline (VIBRA-TABS) 100 MG tablet Take 1 tablet (100 mg total) by mouth 2 (two) times daily.  ? fluconazole (DIFLUCAN) 150 MG tablet Take 1 tablet (150 mg total) by mouth daily. -For your yeast infection, start the Diflucan (fluconazole)- Take one pill today (day 1). If you're still having symptoms in 3  days, take the second pill.  ? metFORMIN (GLUCOPHAGE-XR) 500 MG 24 hr tablet Take 1 tablet (500 mg total) by mouth in the morning and at bedtime.  ? nystatin cream (MYCOSTATIN) Apply 1 application topically 2 (two) times daily.  ? olmesartan (BENICAR) 20 MG tablet Take 1 tablet (20 mg total) by mouth daily.  ? [DISCONTINUED] glipiZIDE (GLUCOTROL) 5 MG tablet Take 1 tablet (5 mg total) by mouth 2 (two) times daily.  ? [DISCONTINUED] lisinopril (ZESTRIL) 20 MG tablet Take 1 tablet (20 mg total) by mouth 2 (two) times daily.  ? [DISCONTINUED] metFORMIN (GLUCOPHAGE) 500 MG tablet Take 1 tablet (500 mg total) by mouth 2 (two) times daily with a meal.  ? [DISCONTINUED] sitaGLIPtin (JANUVIA) 100 MG tablet Take 1 tablet (100 mg total) by mouth daily.  ? ? ?ROS:  ?Review of Systems  ?Eyes:  Negative for blurred vision.  ?Respiratory:  Negative for shortness of breath.   ?Cardiovascular:  Negative for chest pain.  ?Neurological:  Negative for headaches.  ? ? ?Objective:  ? ?Today's Vitals: BP (!) 170/84 (BP Location: Right Arm, Patient Position: Sitting, Cuff Size: Large)   Pulse 85   Temp 98.1 ?F (36.7 ?C) (Oral)   Ht 5' 4.5" (1.638 m)   Wt 174 lb (78.9 kg)   SpO2 91%   BMI  29.41 kg/m?  ? ?  02/22/2022  ?  3:01 PM 01/04/2022  ? 10:02 AM 11/27/2021  ?  6:01 PM  ?Vitals with BMI  ?Height 5' 4.5" 5' 4.5"   ?Weight 174 lbs 176 lbs 6 oz   ?BMI 29.42 29.82   ?Systolic 170 158 681  ?Diastolic 84 92 96  ?Pulse 85 85 105  ?  ? ?Physical Exam ?Vitals reviewed.  ?Constitutional:   ?   General: She is not in acute distress. ?   Appearance: Normal appearance.  ?HENT:  ?   Head: Normocephalic and atraumatic.  ?   Right Ear: Tenderness present. Tympanic membrane is erythematous.  ?Neck:  ?   Vascular: No carotid bruit.  ?Cardiovascular:  ?   Rate and Rhythm: Normal rate and regular rhythm.  ?   Pulses: Normal pulses.  ?   Heart sounds: Normal heart sounds.  ?Pulmonary:  ?   Effort: Pulmonary effort is normal.  ?   Breath sounds: Normal  breath sounds.  ?Skin: ?   General: Skin is warm and dry.  ?Neurological:  ?   General: No focal deficit present.  ?   Mental Status: She is alert and oriented to person, place, and time.  ?Psychiatric:     ?   Mood and Affect: Mood normal.     ?   Behavior: Behavior normal.     ?   Judgment: Judgment normal.  ? ? ? ? ? ? ? ?Assessment and Plan  ? ?1. Right otitis media, unspecified otitis media type   ?2. Primary hypertension   ?3. Type 2 diabetes mellitus with hyperglycemia, without long-term current use of insulin (HCC)   ? ? ? ?Plan: ?See plan via problem list below. ? ? ?Tests ordered ?Orders Placed This Encounter  ?Procedures  ? Basic Metabolic Panel (BMET)  ? ? ? ? ?Meds ordered this encounter  ?Medications  ? amLODipine (NORVASC) 10 MG tablet  ?  Sig: Take 1 tablet (10 mg total) by mouth daily.  ?  Dispense:  90 tablet  ?  Refill:  1  ? sitaGLIPtin (JANUVIA) 100 MG tablet  ?  Sig: Take 1 tablet (100 mg total) by mouth daily.  ?  Dispense:  90 tablet  ?  Refill:  1  ? glipiZIDE (GLUCOTROL) 5 MG tablet  ?  Sig: Take 1 tablet (5 mg total) by mouth 2 (two) times daily.  ?  Dispense:  180 tablet  ?  Refill:  1  ? doxycycline (VIBRA-TABS) 100 MG tablet  ?  Sig: Take 1 tablet (100 mg total) by mouth 2 (two) times daily.  ?  Dispense:  14 tablet  ?  Refill:  0  ?  Order Specific Question:   Supervising Provider  ?  Answer:   Pincus Sanes [[id number]]  ? olmesartan (BENICAR) 20 MG tablet  ?  Sig: Take 1 tablet (20 mg total) by mouth daily.  ?  Dispense:  30 tablet  ?  Refill:  1  ?  Order Specific Question:   Supervising Provider  ?  Answer:   Pincus Sanes [[id number]]  ? metFORMIN (GLUCOPHAGE-XR) 500 MG 24 hr tablet  ?  Sig: Take 1 tablet (500 mg total) by mouth in the morning and at bedtime.  ?  Dispense:  180 tablet  ?  Refill:  1  ?  Order Specific Question:   Supervising Provider  ?  Answer:   Pincus Sanes [[id number]]  ? ? ?  Patient to follow-up in 2 weeks or sooner as needed. ? ?Elenore PaddySARAH E Sunya Humbarger, NP ? ?

## 2022-03-08 ENCOUNTER — Other Ambulatory Visit: Payer: Self-pay | Admitting: Nurse Practitioner

## 2022-03-08 ENCOUNTER — Telehealth: Payer: Medicare HMO | Admitting: Nurse Practitioner

## 2022-03-08 DIAGNOSIS — I10 Essential (primary) hypertension: Secondary | ICD-10-CM

## 2022-03-20 DIAGNOSIS — I10 Essential (primary) hypertension: Secondary | ICD-10-CM | POA: Diagnosis not present

## 2022-03-21 LAB — BASIC METABOLIC PANEL
BUN: 20 mg/dL (ref 7–25)
CO2: 28 mmol/L (ref 20–32)
Calcium: 9.3 mg/dL (ref 8.6–10.4)
Chloride: 97 mmol/L — ABNORMAL LOW (ref 98–110)
Creat: 0.75 mg/dL (ref 0.50–1.05)
Glucose, Bld: 325 mg/dL — ABNORMAL HIGH (ref 65–139)
Potassium: 4.2 mmol/L (ref 3.5–5.3)
Sodium: 135 mmol/L (ref 135–146)

## 2022-03-22 ENCOUNTER — Encounter: Payer: Self-pay | Admitting: Nurse Practitioner

## 2022-03-22 ENCOUNTER — Telehealth (INDEPENDENT_AMBULATORY_CARE_PROVIDER_SITE_OTHER): Payer: Medicare HMO | Admitting: Nurse Practitioner

## 2022-03-22 ENCOUNTER — Telehealth: Payer: Medicare HMO | Admitting: Nurse Practitioner

## 2022-03-22 VITALS — BP 138/78

## 2022-03-22 DIAGNOSIS — E1165 Type 2 diabetes mellitus with hyperglycemia: Secondary | ICD-10-CM | POA: Diagnosis not present

## 2022-03-22 DIAGNOSIS — I10 Essential (primary) hypertension: Secondary | ICD-10-CM

## 2022-03-22 DIAGNOSIS — N3090 Cystitis, unspecified without hematuria: Secondary | ICD-10-CM | POA: Diagnosis not present

## 2022-03-22 MED ORDER — NITROFURANTOIN MONOHYD MACRO 100 MG PO CAPS: 100.0000 mg | ORAL_CAPSULE | Freq: Two times a day (BID) | ORAL | 0 refills | Status: DC

## 2022-03-22 NOTE — Assessment & Plan Note (Signed)
Chronic, she will follow-up in 6 weeks for repeat A1c.  She is hesitant to try injectable medication, but I did tell her today if A1c remains elevated she probably needs to try injectables.  She reports she would be willing to consider this.  She was encouraged to continue focusing on lifestyle measures aimed at reducing blood sugars. ?

## 2022-03-22 NOTE — Progress Notes (Signed)
? ? ?An audio-only tele-health visit was conducted today. I connected with  Sheryl Mcknight on 03/22/22 utilizing audio-only technology and verified that I am speaking with the correct person using two identifiers. The patient was located at their home, and I was located at the office of [place] at [facility] during the encounter. I discussed the limitations of evaluation and management by telemedicine. The patient expressed understanding and agreed to proceed.  ? ?Subjective:  ?Patient ID: Sheryl Mcknight, female    DOB: [birth year]  Age: 71 y.o. MRN: [id number] ? ?CC:  ?Chief Complaint  ?Patient presents with  ? Hypertension  ? Diabetes  ? Cystitis  ?  ? ? ?HPI  ?This patient arrives today for the above. ? ?Hypertension: She continues on amlodipine 10 mg mouth daily, she started on olmesartan 20 mg by mouth daily at last office visit.  She is tolerating olmesartan well.  She had metabolic panel rechecked which shows stable kidney function and normal potassium.  She tells me at home blood pressures running 130s-140/70s-80s. ? ?Diabetes: Last A1c was collected about 3 months ago and it was 12.2.  She is currently on glipizide 5 mg mouth twice a day, metformin 500 mg twice a day, and Januvia 100 mg daily.  She tolerated medication well.  She tells me since her last A1c result she is been making significant lifestyle changes.  She been working out more often, has reduced her intake of sodas including sugar-free sodas, increasing her water, and reduction of carbohydrate intake.  Per chart review last metabolic panel collected 2 days ago did show glucose of 325. ? ?Cystitis: She feels that she may be having a urinary tract infection.  She reports urinary urgency and frequency.  She denies hematuria. ? ?Past Medical History:  ?Diagnosis Date  ? Diabetes mellitus without complication ([place])   ? Hypertension   ? Vitamin D deficiency disease   ? ? ? ? ?History reviewed. No pertinent family history. ? ?Social  History  ? ?Social History Narrative  ? [place] murdered 66 yrs ago.Lives with 3 sons.Retired-previously worked in [employer] .  ? ?Social History  ? ?Tobacco Use  ? Smoking status: Never  ? Smokeless tobacco: Never  ?Substance Use Topics  ? Alcohol use: No  ? ? ? ?Current Meds  ?Medication Sig  ? amLODipine (NORVASC) 10 MG tablet Take 1 tablet (10 mg total) by mouth daily.  ? Cholecalciferol (VITAMIN D-3) 125 MCG (5000 UT) TABS Take 1 tablet by mouth daily.  ? glipiZIDE (GLUCOTROL) 5 MG tablet Take 1 tablet (5 mg total) by mouth 2 (two) times daily.  ? metFORMIN (GLUCOPHAGE-XR) 500 MG 24 hr tablet Take 1 tablet (500 mg total) by mouth in the morning and at bedtime.  ? nitrofurantoin, macrocrystal-monohydrate, (MACROBID) 100 MG capsule Take 1 capsule (100 mg total) by mouth 2 (two) times daily.  ? nystatin cream (MYCOSTATIN) Apply 1 application topically 2 (two) times daily.  ? olmesartan (BENICAR) 20 MG tablet Take 1 tablet (20 mg total) by mouth daily.  ? sitaGLIPtin (JANUVIA) 100 MG tablet Take 1 tablet (100 mg total) by mouth daily.  ? ? ?ROS:  ?Review of Systems  ?Eyes:  Negative for blurred vision.  ?Respiratory:  Negative for shortness of breath.   ?Cardiovascular:  Negative for chest pain.  ?Gastrointestinal:  Negative for abdominal pain, diarrhea, nausea and vomiting.  ?Genitourinary:  Positive for dysuria ((+) strong smell), flank pain and frequency. Negative for hematuria.  ?Neurological:  Negative for headaches.  ? ? ?Objective:  ? ?Today's Vitals: BP 138/78  ? ?  03/22/2022  ?  1:18 PM 02/22/2022  ?  3:01 PM 01/04/2022  ? 10:02 AM  ?Vitals with BMI  ?Height  5' 4.5" 5' 4.5"  ?Weight  174 lbs 176 lbs 6 oz  ?BMI  29.42 29.82  ?Systolic 0000000 [id number] 0000000  ?Diastolic 78 84 92  ?Pulse  85 85  ?  ? ?Physical Exam ?Comprehensive physical exam not completed today as office visit was conducted remotely.  Patient sounded well over the phone..  Patient was alert and oriented, and appeared to have appropriate  judgment. ? ? ? ? ? ? ?Assessment and Plan  ? ?1. Cystitis   ?2. Type 2 diabetes mellitus with hyperglycemia, without long-term current use of insulin ([place])   ?3. Primary hypertension   ? ? ? ?Plan: ?See plan via problem list below. ? ? ?Tests ordered ?No orders of the defined types were placed in this encounter. ? ? ? ? ?Meds ordered this encounter  ?Medications  ? nitrofurantoin, macrocrystal-monohydrate, (MACROBID) 100 MG capsule  ?  Sig: Take 1 capsule (100 mg total) by mouth 2 (two) times daily.  ?  Dispense:  10 capsule  ?  Refill:  0  ?  Order Specific Question:   Supervising Provider  ?  Answer:   Binnie Rail [id number]  ? ? ?Patient to follow-up in 6 weeks for diabetes and HTN f/u.  Total time spent on phone today was 12 minutes and 40 seconds. ? ?Ailene Ards, NP ? ?

## 2022-03-22 NOTE — Assessment & Plan Note (Signed)
Symptoms consistent with urinary tract infection.  We will treat empirically with Macrobid as patient is allergic to sulfas and ciprofloxacin. ?

## 2022-03-22 NOTE — Assessment & Plan Note (Signed)
Chronic, appears to be improved since switching from lisinopril to olmesartan.  She will continue olmesartan 20 mg by mouth daily, and amlodipine 10 mg a mouth daily. ?

## 2022-04-11 ENCOUNTER — Encounter: Payer: Self-pay | Admitting: Nurse Practitioner

## 2022-04-11 ENCOUNTER — Telehealth (INDEPENDENT_AMBULATORY_CARE_PROVIDER_SITE_OTHER): Payer: Medicare HMO | Admitting: Nurse Practitioner

## 2022-04-11 DIAGNOSIS — J0110 Acute frontal sinusitis, unspecified: Secondary | ICD-10-CM | POA: Diagnosis not present

## 2022-04-11 DIAGNOSIS — R21 Rash and other nonspecific skin eruption: Secondary | ICD-10-CM | POA: Diagnosis not present

## 2022-04-11 DIAGNOSIS — H109 Unspecified conjunctivitis: Secondary | ICD-10-CM | POA: Diagnosis not present

## 2022-04-11 MED ORDER — DOXYCYCLINE HYCLATE 100 MG PO TABS: 100.0000 mg | ORAL_TABLET | Freq: Two times a day (BID) | ORAL | 0 refills | Status: DC

## 2022-04-11 MED ORDER — GENTAMICIN SULFATE 0.3 % OP SOLN: 2.0000 [drp] | OPHTHALMIC | 0 refills | Status: DC

## 2022-04-11 NOTE — Progress Notes (Signed)
Acute Office Visit  An audio-only tele-health visit was completed today for this patient. I connected with  Sheryl Mcknight on 04/11/22 utilizing audio-only technology and verified that I am speaking with the correct person using two identifiers. The patient was located at their home, and I was located at the office of [facility] at [facility] during the encounter. I discussed the limitations of evaluation and management by telemedicine. The patient expressed understanding and agreed to proceed.     Subjective:     Patient ID: Sheryl Mcknight, female    DOB: 06-05-1951, 71 y.o.   MRN: [id number]  Chief Complaint  Patient presents with   Sinusitis    HPI Patient is in today for the above. Symptoms started a little over a week ago.  She reports that she had a fever which has now resolved.  She is also experiencing cough, nasal congestion, and 2 days ago woke up with significant purulent drainage from her left eye as well as redness to her left eye.  She denies eye pain or blurry vision.  She reports that she was exposed to a sick granddaughter who is also having similar symptoms.  She also reports that she has a rash that periodically comes back.  She tells me she is taking an over-the-counter yeast pill which is helping to resolve the rash.  She has history of uncontrolled diabetes.  Last A1c greater than 12.  She has been hesitant to try injectable medication, but may be willing to consider this if next A1c remains high.  Review of Systems  Constitutional:  Positive for fever.  HENT:  Positive for congestion and nosebleeds.   Respiratory:  Positive for cough and shortness of breath. Negative for sputum production.   Cardiovascular:  Negative for chest pain.       Objective:    There were no vitals taken for this visit. BP Readings from Last 3 Encounters:  03/22/22 138/78  02/22/22 (!) 170/84  01/04/22 (!) 158/92   Wt Readings from Last 3 Encounters:  02/22/22 174 lb  (78.9 kg)  01/04/22 176 lb 6.4 oz (80 kg)  05/29/21 174 lb 9.6 oz (79.2 kg)      Physical Exam Comprehensive physical exam not completed today as office visit was conducted remotely.  Patient sounded well over the phone.  Patient was alert and oriented, and appeared to have appropriate judgment.  No results found for any visits on 04/11/22.      Assessment & Plan:  Rash: Strong suspicion that rash may be related to type 2 diabetes.  However because it continues to recur will refer to dermatology for further assistance with evaluating. 2. , 3.  Bacterial conjunctivitis: History does sound like she has bacterial conjunctivitis.  She also sounds like she may have sinusitis.  We will treat with p.o. doxycycline as she has allergy to penicillin, sulfa, and ciprofloxacin.  If eye symptoms do not improve with doxycycline she will also be prescribed gentamicin eyedrops.  Problem List Items Addressed This Visit   None Visit Diagnoses     Rash    -  Primary   Relevant Orders   Ambulatory referral to Dermatology   Bacterial conjunctivitis       Relevant Medications   gentamicin (GARAMYCIN) 0.3 % ophthalmic solution   Acute non-recurrent frontal sinusitis       Relevant Medications   doxycycline (VIBRA-TABS) 100 MG tablet       Meds ordered this encounter  Medications   gentamicin (GARAMYCIN) 0.3 % ophthalmic solution    Sig: Place 2 drops into the left eye every 4 (four) hours. For 10 days.    Dispense:  15 mL    Refill:  0    Order Specific Question:   Supervising Provider    Answer:   BURNS, Bobette Mo [[id number]]   doxycycline (VIBRA-TABS) 100 MG tablet    Sig: Take 1 tablet (100 mg total) by mouth 2 (two) times daily.    Dispense:  14 tablet    Refill:  0    Order Specific Question:   Supervising Provider    Answer:   Pincus Sanes [[id number]]    Return for As scheduled or sooner as needed..  Total time spent the telephone was 18 minutes.  Elenore Paddy, NP

## 2022-04-24 ENCOUNTER — Telehealth: Payer: Self-pay | Admitting: Nurse Practitioner

## 2022-04-24 NOTE — Telephone Encounter (Signed)
Sheryl Mcknight is calling recommending patient to  be prescribed a statin drug to lower heart attack and stroke risk  Please advise

## 2022-04-25 ENCOUNTER — Other Ambulatory Visit: Payer: Self-pay | Admitting: Nurse Practitioner

## 2022-04-25 DIAGNOSIS — I10 Essential (primary) hypertension: Secondary | ICD-10-CM

## 2022-04-26 NOTE — Telephone Encounter (Signed)
yes

## 2022-05-01 ENCOUNTER — Other Ambulatory Visit: Payer: Self-pay | Admitting: Nurse Practitioner

## 2022-05-01 ENCOUNTER — Telehealth: Payer: Self-pay | Admitting: Nurse Practitioner

## 2022-05-01 DIAGNOSIS — I10 Essential (primary) hypertension: Secondary | ICD-10-CM

## 2022-05-01 MED ORDER — OLMESARTAN MEDOXOMIL 20 MG PO TABS: 20.0000 mg | ORAL_TABLET | Freq: Every day | ORAL | 1 refills | Status: DC

## 2022-05-01 NOTE — Telephone Encounter (Signed)
Caller & Relationship to patient: Sherika Kubicki  Call back number: [phone]  Date of last office visit: 02/22/22  Date of next office visit: 06/13/22  Medication(s) to be refilled:  olmesartan (BENICAR) 20 MG tablet      Preferred Pharmacy:  [facility] [address], [place] - [address] [place] [address] Phone:  [phone]  Fax:  [phone]

## 2022-05-02 ENCOUNTER — Ambulatory Visit: Payer: Medicare HMO | Admitting: Nurse Practitioner

## 2022-06-13 ENCOUNTER — Ambulatory Visit: Payer: Medicare HMO | Admitting: Nurse Practitioner

## 2022-07-05 ENCOUNTER — Other Ambulatory Visit (INDEPENDENT_AMBULATORY_CARE_PROVIDER_SITE_OTHER): Payer: Medicare HMO

## 2022-07-05 ENCOUNTER — Ambulatory Visit (INDEPENDENT_AMBULATORY_CARE_PROVIDER_SITE_OTHER): Payer: Medicare HMO | Admitting: Nurse Practitioner

## 2022-07-05 VITALS — BP 138/74 | HR 92 | Temp 98.4°F | Ht 64.0 in | Wt 176.2 lb

## 2022-07-05 DIAGNOSIS — R8290 Unspecified abnormal findings in urine: Secondary | ICD-10-CM

## 2022-07-05 DIAGNOSIS — E1165 Type 2 diabetes mellitus with hyperglycemia: Secondary | ICD-10-CM

## 2022-07-05 DIAGNOSIS — H6692 Otitis media, unspecified, left ear: Secondary | ICD-10-CM | POA: Diagnosis not present

## 2022-07-05 LAB — COMPREHENSIVE METABOLIC PANEL
ALT: 46 U/L — ABNORMAL HIGH (ref 0–35)
AST: 37 U/L (ref 0–37)
Albumin: 3.9 g/dL (ref 3.5–5.2)
Alkaline Phosphatase: 116 U/L (ref 39–117)
BUN: 14 mg/dL (ref 6–23)
CO2: 30 mEq/L (ref 19–32)
Calcium: 9.4 mg/dL (ref 8.4–10.5)
Chloride: 93 mEq/L — ABNORMAL LOW (ref 96–112)
Creatinine, Ser: 0.85 mg/dL (ref 0.40–1.20)
GFR: 69.91 mL/min (ref >60.00)
Glucose, Bld: 320 mg/dL — ABNORMAL HIGH (ref 70–99)
Potassium: 3.8 mEq/L (ref 3.5–5.1)
Sodium: 133 mEq/L — ABNORMAL LOW (ref 135–145)
Total Bilirubin: 0.4 mg/dL (ref 0.2–1.2)
Total Protein: 8.3 g/dL (ref 6.0–8.3)

## 2022-07-05 LAB — URINALYSIS WITH CULTURE, IF INDICATED
Bilirubin Urine: NEGATIVE
Hgb urine dipstick: NEGATIVE
Ketones, ur: NEGATIVE
Leukocytes,Ua: NEGATIVE
Nitrite: NEGATIVE
RBC / HPF: NONE SEEN (ref 0–?)
Specific Gravity, Urine: <=1.005 — AB (ref 1.000–1.030)
Total Protein, Urine: NEGATIVE
Urine Glucose: >=1000 — AB
Urobilinogen, UA: 0.2 (ref 0.0–1.0)
pH: 6 (ref 5.0–8.0)

## 2022-07-05 MED ORDER — DOXYCYCLINE HYCLATE 100 MG PO TABS: 100.0000 mg | ORAL_TABLET | Freq: Two times a day (BID) | ORAL | 0 refills | Status: DC

## 2022-07-05 MED ORDER — METFORMIN HCL 500 MG PO TABS: 500.0000 mg | ORAL_TABLET | Freq: Two times a day (BID) | ORAL | 1 refills | Status: DC

## 2022-07-05 NOTE — Progress Notes (Signed)
Established Patient Office Visit  Subjective   Patient ID: Sheryl Mcknight, female    DOB: 1951-08-14  Age: 71 y.o. MRN: [id number]  Chief Complaint  Patient presents with   Follow-up   Diabetes    Diabetes: Last A1c greater than 12.  Patient continues on metformin, glipizide, and Januvia.  In the past she has been unwilling to try medications that are injectables.  She would be willing to try at this point.  She is on ARB, she is not on statin.  No personal or family history of thyroid cancer.  Strong urine odor: She reports that she has had strong urine order and describes it as fishy.  She denies dysuria, urgency, or frequency.  She does report vaginal itching.   Left Ear pain: Has been present for approximately 2 weeks.     Review of Systems  Respiratory:  Negative for shortness of breath.   Cardiovascular:  Negative for chest pain.  Neurological:  Negative for headaches.      Objective:     BP 138/74 (BP Location: Right Arm, Patient Position: Sitting, Cuff Size: Large)   Pulse 92   Temp 98.4 F (36.9 C) (Oral)   Ht 5\' 4"  (1.626 m)   Wt 176 lb 4 oz (79.9 kg)   SpO2 93%   BMI 30.25 kg/m    Physical Exam Vitals reviewed.  Constitutional:      General: She is not in acute distress.    Appearance: Normal appearance.  HENT:     Head: Normocephalic and atraumatic.     Left Ear: Swelling present. Tympanic membrane is erythematous.  Neck:     Vascular: No carotid bruit.  Cardiovascular:     Rate and Rhythm: Normal rate and regular rhythm.     Pulses: Normal pulses.     Heart sounds: Normal heart sounds.  Pulmonary:     Effort: Pulmonary effort is normal.     Breath sounds: Normal breath sounds.  Skin:    General: Skin is warm and dry.  Neurological:     General: No focal deficit present.     Mental Status: She is alert and oriented to person, place, and time.  Psychiatric:        Mood and Affect: Mood normal.        Behavior: Behavior normal.         Judgment: Judgment normal.      No results found for any visits on 07/05/22.    The 10-year ASCVD risk score (Arnett DK, et al., 2019) is: 22.3%    Assessment & Plan:   Problem List Items Addressed This Visit       Endocrine   Type 2 diabetes mellitus with hyperglycemia, without long-term current use of insulin (HCC)    Chronic, not well controlled.  Stop Januvia.  Start Ozempic 0.25 mg per subcutaneous injection weekly.  Patient educated on how to administer medication, the importance of rotating injection sites, importance of proper disposal of use needles, also educated on possible side effects.  Patient also continue olmesartan, metformin, and glipizide.  Patient to follow-up in 1 month to discuss how she tolerates the medication. Patient told to keep medication refrigerated.       Relevant Medications   metFORMIN (GLUCOPHAGE) 500 MG tablet   Semaglutide,0.25 or 0.5MG /DOS, 2 MG/1.5ML SOPN   Other Relevant Orders   Hemoglobin A1c   Comprehensive metabolic panel     Nervous and Auditory   Left otitis media -  Primary    Acute, due to patient allergies treat with doxycycline 100mg  BID x 7 days. Patient encouraged to notify if symptoms persist or worsen.       Relevant Medications   doxycycline (VIBRA-TABS) 100 MG tablet     Other   Abnormal urine odor    We will check urinalysis reflex to culture, may consider treating empirically for bacterial vaginosis pending results.      Relevant Orders   Urinalysis with Culture, if indicated    Return in about 1 month (around 08/05/2022) for F/U with Rohit Deloria may be virtual.    08/07/2022, NP

## 2022-07-05 NOTE — Assessment & Plan Note (Signed)
We will check urinalysis reflex to culture, may consider treating empirically for bacterial vaginosis pending results.

## 2022-07-05 NOTE — Assessment & Plan Note (Signed)
Acute, due to patient allergies treat with doxycycline 100mg  BID x 7 days. Patient encouraged to notify if symptoms persist or worsen.

## 2022-07-05 NOTE — Assessment & Plan Note (Addendum)
Chronic, not well controlled.  Stop Januvia.  Start Ozempic 0.25 mg per subcutaneous injection weekly.  Patient educated on how to administer medication, the importance of rotating injection sites, importance of proper disposal of use needles, also educated on possible side effects.  Patient also continue olmesartan, metformin, and glipizide.  Patient to follow-up in 1 month to discuss how she tolerates the medication. Patient told to keep medication refrigerated.

## 2022-07-05 NOTE — Patient Instructions (Signed)
STOP Januvia START Ozemoic

## 2022-07-06 LAB — HEMOGLOBIN A1C
Hgb A1c MFr Bld: 9.8 %{Hb} — ABNORMAL HIGH
Mean Plasma Glucose: 235 mg/dL
eAG (mmol/L): 13 mmol/L

## 2022-07-07 ENCOUNTER — Encounter: Payer: Self-pay | Admitting: Nurse Practitioner

## 2022-07-07 DIAGNOSIS — N3000 Acute cystitis without hematuria: Secondary | ICD-10-CM

## 2022-07-07 LAB — URINE CULTURE
MICRO NUMBER:: 13832873
SPECIMEN QUALITY:: ADEQUATE

## 2022-07-07 NOTE — Telephone Encounter (Signed)
This encounter was created in error - please disregard.

## 2022-07-08 ENCOUNTER — Encounter: Payer: Self-pay | Admitting: Nurse Practitioner

## 2022-07-08 NOTE — Telephone Encounter (Signed)
This encounter was created in error - please disregard.

## 2022-07-11 ENCOUNTER — Encounter: Payer: Self-pay | Admitting: Nurse Practitioner

## 2022-08-02 ENCOUNTER — Other Ambulatory Visit: Payer: Self-pay | Admitting: Nurse Practitioner

## 2022-08-02 DIAGNOSIS — R300 Dysuria: Secondary | ICD-10-CM

## 2022-08-02 MED ORDER — NITROFURANTOIN MONOHYD MACRO 100 MG PO CAPS: 100.0000 mg | ORAL_CAPSULE | Freq: Two times a day (BID) | ORAL | 0 refills | Status: DC

## 2022-08-02 NOTE — Progress Notes (Signed)
Order for UTI

## 2022-08-05 ENCOUNTER — Ambulatory Visit (INDEPENDENT_AMBULATORY_CARE_PROVIDER_SITE_OTHER): Payer: Medicare HMO

## 2022-08-05 VITALS — Ht 67.0 in | Wt 167.0 lb

## 2022-08-05 DIAGNOSIS — Z0000 Encounter for general adult medical examination without abnormal findings: Secondary | ICD-10-CM

## 2022-08-05 NOTE — Progress Notes (Signed)
Subjective:   Sheryl Mcknight is a 71 y.o. female who presents for Medicare Annual (Subsequent) preventive examination.   Virtual Visit via Telephone Note  I connected with  [name] on 08/05/22 at  8:45 AM EDT by telephone and verified that I am speaking with the correct person using two identifiers.  Location: Patient: home  Provider: Addison Bailey  Persons participating in the virtual visit: patient/Nurse Health Advisor   I discussed the limitations, risks, security and privacy concerns of performing an evaluation and management service by telephone and the availability of in person appointments. The patient expressed understanding and agreed to proceed.  Interactive audio and video telecommunications were attempted between this nurse and patient, however failed, due to patient having technical difficulties OR patient did not have access to video capability.  We continued and completed visit with audio only.  Some vital signs may be absent or patient reported.   Lorrene Reid, LPN  Review of Systems     Cardiac Risk Factors include: advanced age (>89men, >24 women);diabetes mellitus;hypertension     Objective:    Today's Vitals   08/05/22 0852  Weight: 167 lb (75.8 kg)  Height: 5\' 7"  (1.702 m)   Body mass index is 26.16 kg/m.     08/05/2022    9:00 AM 05/29/2021    1:26 PM 04/26/2021   12:22 AM  Advanced Directives  Does Patient Have a Medical Advance Directive? No No No  Would patient like information on creating a medical advance directive? No - Patient declined No - Patient declined No - Patient declined    Current Medications (verified) Outpatient Encounter Medications as of 08/05/2022  Medication Sig   Cholecalciferol (VITAMIN D-3) 125 MCG (5000 UT) TABS Take 1 tablet by mouth daily.   doxycycline (VIBRA-TABS) 100 MG tablet Take 1 tablet (100 mg total) by mouth 2 (two) times daily.   metFORMIN (GLUCOPHAGE) 500 MG tablet Take 1 tablet (500 mg total) by mouth  2 (two) times daily with a meal.   olmesartan (BENICAR) 20 MG tablet Take 1 tablet (20 mg total) by mouth daily.   Semaglutide,0.25 or 0.5MG /DOS, 2 MG/1.5ML SOPN Inject into the skin.   amLODipine (NORVASC) 10 MG tablet Take 1 tablet (10 mg total) by mouth daily.   glipiZIDE (GLUCOTROL) 5 MG tablet Take 1 tablet (5 mg total) by mouth 2 (two) times daily.   nitrofurantoin, macrocrystal-monohydrate, (MACROBID) 100 MG capsule Take 1 capsule (100 mg total) by mouth 2 (two) times daily. (Patient not taking: Reported on 08/05/2022)   nystatin cream (MYCOSTATIN) Apply 1 application topically 2 (two) times daily. (Patient not taking: Reported on 08/05/2022)   No facility-administered encounter medications on file as of 08/05/2022.    Allergies (verified) Penicillins, Sulfa antibiotics, and Ciprofloxacin   History: Past Medical History:  Diagnosis Date   Diabetes mellitus without complication (HCC)    Hypertension    Vitamin D deficiency disease    Past Surgical History:  Procedure Laterality Date   CESAREAN SECTION     History reviewed. No pertinent family history. Social History   Socioeconomic History   Marital status: Widowed    Spouse name: Not on file   Number of children: Not on file   Years of education: Not on file   Highest education level: Not on file  Occupational History   Not on file  Tobacco Use   Smoking status: Never   Smokeless tobacco: Never  Vaping Use   Vaping Use: Never used  Substance and Sexual Activity   Alcohol use: No   Drug use: No    Frequency: 3.0 times per week   Sexual activity: Not on file  Other Topics Concern   Not on file  Social History Narrative   Widow,husband murdered 20 yrs ago.Lives with 3 sons.Retired-previously worked in [employer] .   Social Determinants of Health   Financial Resource Strain: Low Risk  (08/05/2022)   Overall Financial Resource Strain (CARDIA)    Difficulty of Paying Living Expenses: Not hard at all  Food Insecurity:  No Food Insecurity (08/05/2022)   Hunger Vital Sign    Worried About Running Out of Food in the Last Year: Never true    Ran Out of Food in the Last Year: Never true  Transportation Needs: No Transportation Needs (08/05/2022)   PRAPARE - Administrator, Civil Service (Medical): No    Lack of Transportation (Non-Medical): No  Physical Activity: Sufficiently Active (08/05/2022)   Exercise Vital Sign    Days of Exercise per Week: 5 days    Minutes of Exercise per Session: 40 min  Stress: No Stress Concern Present (08/05/2022)   [employer] of Occupational Health - Occupational Stress Questionnaire    Feeling of Stress : Not at all  Social Connections: Moderately Isolated (08/05/2022)   Social Connection and Isolation Panel [NHANES]    Frequency of Communication with Friends and Family: More than three times a week    Frequency of Social Gatherings with Friends and Family: More than three times a week    Attends Religious Services: Never    Database administrator or Organizations: Yes    Attends Engineer, structural: More than 4 times per year    Marital Status: Widowed    Tobacco Counseling Counseling given: Not Answered   Clinical Intake:  Pre-visit preparation completed: Yes  Pain : No/denies pain     Nutritional Risks: None Diabetes: No  How often do you need to have someone help you when you read instructions, pamphlets, or other written materials from your doctor or pharmacy?: 1 - Never  Diabetic?yes Nutrition Risk Assessment:  Has the patient had any N/V/D within the last 2 months?  No  Does the patient have any non-healing wounds?  No  Has the patient had any unintentional weight loss or weight gain?  No   Diabetes:  Is the patient diabetic?  Yes  If diabetic, was a CBG obtained today?  No  Did the patient bring in their glucometer from home?  No  How often do you monitor your CBG's? Daily .   Financial Strains and Diabetes  Management:  Are you having any financial strains with the device, your supplies or your medication? No .  Does the patient want to be seen by Chronic Care Management for management of their diabetes?  No  Would the patient like to be referred to a Nutritionist or for Diabetic Management?  No   Diabetic Exams:  Diabetic Eye Exam: Overdue for diabetic eye exam. Pt has been advised about the importance in completing this exam. Patient advised to call and schedule an eye exam. Diabetic Foot Exam: Overdue, Pt has been advised about the importance in completing this exam. Pt is scheduled for diabetic foot exam on next office visit .   Interpreter Needed?: No  Information entered by :: Renie Ora, LPN   Activities of Daily Living    08/05/2022    9:00 AM  In your present  state of health, do you have any difficulty performing the following activities:  Hearing? 0  Vision? 0  Difficulty concentrating or making decisions? 0  Walking or climbing stairs? 0  Dressing or bathing? 0  Doing errands, shopping? 0  Preparing Food and eating ? N  Using the Toilet? N  In the past six months, have you accidently leaked urine? N  Do you have problems with loss of bowel control? N  Managing your Medications? N  Managing your Finances? N  Housekeeping or managing your Housekeeping? N    Patient Care Team: Ailene Ards, NP as PCP - General (Nurse Practitioner)  Indicate any recent Medical Services you may have received from other than [facility] providers in the past year (date may be approximate).     Assessment:   This is a routine wellness examination for Rayn.  Hearing/Vision screen Vision Screening - Comments:: Due eye exam patient will schedule   Dietary issues and exercise activities discussed: Current Exercise Habits: Home exercise routine, Type of exercise: walking;strength training/weights, Time (Minutes): 40, Frequency (Times/Week): 5, Weekly Exercise (Minutes/Week): 200, Intensity:  Mild, Exercise limited by: None identified   Goals Addressed             This Visit's Progress    DIET - INCREASE WATER INTAKE         Depression Screen    08/05/2022    8:58 AM 07/05/2022    3:58 PM 01/04/2022   10:07 AM 05/29/2021    1:08 PM 01/30/2021    3:34 PM 07/27/2020    5:00 PM 02/24/2020    3:48 PM  PHQ 2/9 Scores  PHQ - 2 Score 0 0 0 0 0 1 0  PHQ- 9 Score 0 0  0 0      Fall Risk    08/05/2022    8:53 AM 07/05/2022    3:58 PM 01/04/2022   10:07 AM 05/29/2021    1:07 PM 01/30/2021    3:34 PM  Fall Risk   Falls in the past year? 0 0 0 1 0  Number falls in past yr: 0 0 0 0   Injury with Fall? 0 0 0 1   Risk for fall due to : No Fall Risks No Fall Risks     Follow up Falls prevention discussed Falls evaluation completed       FALL RISK PREVENTION PERTAINING TO THE HOME:  Any stairs in or around the home? No  If so, are there any without handrails? No  Home free of loose throw rugs in walkways, pet beds, electrical cords, etc? Yes  Adequate lighting in your home to reduce risk of falls? Yes   ASSISTIVE DEVICES UTILIZED TO PREVENT FALLS:  Life alert? No  Use of a cane, walker or w/c? No  Grab bars in the bathroom? No  Shower chair or bench in shower? No  Elevated toilet seat or a handicapped toilet? No          08/05/2022    9:01 AM 05/29/2021    1:28 PM  6CIT Screen  What Year? 0 points 0 points  What month? 0 points 0 points  What time? 0 points 0 points  Count back from 20 0 points 0 points  Months in reverse 0 points 0 points  Repeat phrase 2 points 2 points  Total Score 2 points 2 points    Immunizations Immunization History  Administered Date(s) Administered   Fluad Quad(high Dose 65+) 10/14/2019, 10/19/2020, 01/04/2022  Influenza Inj Mdck Quad With Preservative 08/26/2018   Moderna Sars-Covid-2 Vaccination 07/16/2020, 08/13/2020   Pneumococcal Conjugate-13 08/26/2018   Pneumococcal Polysaccharide-23 10/14/2019    TDAP status: Up to  date  Flu Vaccine status: Due, Education has been provided regarding the importance of this vaccine. Advised may receive this vaccine at local pharmacy or Health Dept. Aware to provide a copy of the vaccination record if obtained from local pharmacy or Health Dept. Verbalized acceptance and understanding.  Pneumococcal vaccine status: Up to date  Covid-19 vaccine status: Completed vaccines  Qualifies for Shingles Vaccine? Yes   Zostavax completed No   Shingrix Completed?: No.    Education has been provided regarding the importance of this vaccine. Patient has been advised to call insurance company to determine out of pocket expense if they have not yet received this vaccine. Advised may also receive vaccine at local pharmacy or Health Dept. Verbalized acceptance and understanding.  Screening Tests Health Maintenance  Topic Date Due   Hepatitis C Screening  Never done   DEXA SCAN  Never done   INFLUENZA VACCINE  06/11/2022   FOOT EXAM  01/05/2023 (Originally 07/27/2021)   OPHTHALMOLOGY EXAM  01/05/2023 (Originally 02/04/1961)   COLONOSCOPY (Pts [id number] Insurance coverage will need to be confirmed)  01/05/2023 (Originally 02/03/1998)   TETANUS/TDAP  01/05/2023 (Originally 02/04/1972)   COVID-19 Vaccine (3 - Moderna series) 01/05/2023 (Originally 10/08/2020)   Zoster Vaccines- Shingrix (1 of 2) 01/05/2023 (Originally 02/04/2003)   MAMMOGRAM  03/06/2023 (Originally 02/04/2003)   Diabetic kidney evaluation - Urine ACR  01/04/2023   HEMOGLOBIN A1C  01/05/2023   Diabetic kidney evaluation - GFR measurement  07/06/2023   Pneumonia Vaccine 2465+ Years old  Completed   HPV VACCINES  Aged Out    Health Maintenance  Health Maintenance Due  Topic Date Due   Hepatitis C Screening  Never done   DEXA SCAN  Never done   INFLUENZA VACCINE  06/11/2022    Colorectal cancer screening: Referral to GI placed declined at this time . Pt aware the office will call re: appt.  Mammogram status: Ordered  declined at this time . Pt provided with contact info and advised to call to schedule appt.   Bone Density status: Ordered declined at this time . Pt provided with contact info and advised to call to schedule appt.  Lung Cancer Screening: (Low Dose CT Chest recommended if Age 78-80 years, 30 pack-year currently smoking OR have quit w/in 15years.) does not qualify.   Lung Cancer Screening Referral: n/a  Additional Screening:  Hepatitis C Screening: does not qualify;  Vision Screening: Recommended annual ophthalmology exams for early detection of glaucoma and other disorders of the eye. Is the patient up to date with their annual eye exam?  No  Who is the provider or what is the name of the office in which the patient attends annual eye exams? None , patient to schedule  If pt is not established with a provider, would they like to be referred to a provider to establish care? No .   Dental Screening: Recommended annual dental exams for proper oral hygiene  Community Resource Referral / Chronic Care Management: CRR required this visit?  No   CCM required this visit?  No      Plan:     I have personally reviewed and noted the following in the patient's chart:   Medical and social history Use of alcohol, tobacco or illicit drugs  Current medications and supplements including opioid prescriptions.  Patient is not currently taking opioid prescriptions. Functional ability and status Nutritional status Physical activity Advanced directives List of other physicians Hospitalizations, surgeries, and ER visits in previous 12 months Vitals Screenings to include cognitive, depression, and falls Referrals and appointments  In addition, I have reviewed and discussed with patient certain preventive protocols, quality metrics, and best practice recommendations. A written personalized care plan for preventive services as well as general preventive health recommendations were provided to  patient.     Lorrene Reid, LPN   0/[id number]/[id number]   Nurse Notes: Patient declines, mammogram, DEXA, Colonoscopy  Due TDAP. Flu vaccine ,

## 2022-08-05 NOTE — Patient Instructions (Signed)
Sheryl Mcknight , Thank you for taking time to come for your Medicare Wellness Visit. I appreciate your ongoing commitment to your health goals. Please review the following plan we discussed and let me know if I can assist you in the future.   These are the goals we discussed:  Goals      CCM Expected Outcome:  Monitor, Self-Manage, and Reduce Symptoms of Hypertension     Take medications as prescribed, follow a mediterranean/DASH diet aimed at reducing blood pressure. Goal blood pressure <140/<90.     CCM:  Monitor and Maintain HbA1c <8%     DIET - INCREASE WATER INTAKE        This is a list of the screening recommended for you and due dates:  Health Maintenance  Topic Date Due   Hepatitis C Screening: USPSTF Recommendation to screen - Ages 52-79 yo.  Never done   DEXA scan (bone density measurement)  Never done   Flu Shot  06/11/2022   Complete foot exam   01/05/2023*   Eye exam for diabetics  01/05/2023*   Colon Cancer Screening  01/05/2023*   Tetanus Vaccine  01/05/2023*   COVID-19 Vaccine (3 - Moderna series) 01/05/2023*   Zoster (Shingles) Vaccine (1 of 2) 01/05/2023*   Mammogram  03/06/2023*   Yearly kidney health urinalysis for diabetes  01/04/2023   Hemoglobin A1C  01/05/2023   Yearly kidney function blood test for diabetes  07/06/2023   Pneumonia Vaccine  Completed   HPV Vaccine  Aged Out  *Topic was postponed. The date shown is not the original due date.    Advanced directives: Please bring a copy of your health care power of attorney and living will to the office to be added to your chart at your convenience.   Conditions/risks identified: Aim for 30 minutes of exercise or brisk walking, 6-8 glasses of water, and 5 servings of fruits and vegetables each day.   Next appointment: Follow up in one year for your annual wellness visit    Preventive Care 65 Years and Older, Female Preventive care refers to lifestyle choices and visits with your health care provider that can  promote health and wellness. What does preventive care include? A yearly physical exam. This is also called an annual well check. Dental exams once or twice a year. Routine eye exams. Ask your health care provider how often you should have your eyes checked. Personal lifestyle choices, including: Daily care of your teeth and gums. Regular physical activity. Eating a healthy diet. Avoiding tobacco and drug use. Limiting alcohol use. Practicing safe sex. Taking low-dose aspirin every day. Taking vitamin and mineral supplements as recommended by your health care provider. What happens during an annual well check? The services and screenings done by your health care provider during your annual well check will depend on your age, overall health, lifestyle risk factors, and family history of disease. Counseling  Your health care provider may ask you questions about your: Alcohol use. Tobacco use. Drug use. Emotional well-being. Home and relationship well-being. Sexual activity. Eating habits. History of falls. Memory and ability to understand (cognition). Work and work Statistician. Reproductive health. Screening  You may have the following tests or measurements: Height, weight, and BMI. Blood pressure. Lipid and cholesterol levels. These may be checked every 5 years, or more frequently if you are over 29 years old. Skin check. Lung cancer screening. You may have this screening every year starting at age 2 if you have a 30-pack-year history  of smoking and currently smoke or have quit within the past 15 years. Fecal occult blood test (FOBT) of the stool. You may have this test every year starting at age 34. Flexible sigmoidoscopy or colonoscopy. You may have a sigmoidoscopy every 5 years or a colonoscopy every 10 years starting at age 56. Hepatitis C blood test. Hepatitis B blood test. Sexually transmitted disease (STD) testing. Diabetes screening. This is done by checking your  blood sugar (glucose) after you have not eaten for a while (fasting). You may have this done every 1-3 years. Bone density scan. This is done to screen for osteoporosis. You may have this done starting at age 27. Mammogram. This may be done every 1-2 years. Talk to your health care provider about how often you should have regular mammograms. Talk with your health care provider about your test results, treatment options, and if necessary, the need for more tests. Vaccines  Your health care provider may recommend certain vaccines, such as: Influenza vaccine. This is recommended every year. Tetanus, diphtheria, and acellular pertussis (Tdap, Td) vaccine. You may need a Td booster every 10 years. Zoster vaccine. You may need this after age 71. Pneumococcal 13-valent conjugate (PCV13) vaccine. One dose is recommended after age 89. Pneumococcal polysaccharide (PPSV23) vaccine. One dose is recommended after age 33. Talk to your health care provider about which screenings and vaccines you need and how often you need them. This information is not intended to replace advice given to you by your health care provider. Make sure you discuss any questions you have with your health care provider. Document Released: 11/24/2015 Document Revised: 07/17/2016 Document Reviewed: 08/29/2015 Elsevier Interactive Patient Education  2017 [place] Prevention in the Home Falls can cause injuries. They can happen to people of all ages. There are many things you can do to make your home safe and to help prevent falls. What can I do on the outside of my home? Regularly fix the edges of walkways and driveways and fix any cracks. Remove anything that might make you trip as you walk through a door, such as a raised step or threshold. Trim any bushes or trees on the path to your home. Use bright outdoor lighting. Clear any walking paths of anything that might make someone trip, such as rocks or tools. Regularly  check to see if handrails are loose or broken. Make sure that both sides of any steps have handrails. Any raised decks and porches should have guardrails on the edges. Have any leaves, snow, or ice cleared regularly. Use sand or salt on walking paths during winter. Clean up any spills in your garage right away. This includes oil or grease spills. What can I do in the bathroom? Use night lights. Install grab bars by the toilet and in the tub and shower. Do not use towel bars as grab bars. Use non-skid mats or decals in the tub or shower. If you need to sit down in the shower, use a plastic, non-slip stool. Keep the floor dry. Clean up any water that spills on the floor as soon as it happens. Remove soap buildup in the tub or shower regularly. Attach bath mats securely with double-sided non-slip rug tape. Do not have throw rugs and other things on the floor that can make you trip. What can I do in the bedroom? Use night lights. Make sure that you have a light by your bed that is easy to reach. Do not use any sheets or blankets  that are too big for your bed. They should not hang down onto the floor. Have a firm chair that has side arms. You can use this for support while you get dressed. Do not have throw rugs and other things on the floor that can make you trip. What can I do in the kitchen? Clean up any spills right away. Avoid walking on wet floors. Keep items that you use a lot in easy-to-reach places. If you need to reach something above you, use a strong step stool that has a grab bar. Keep electrical cords out of the way. Do not use floor polish or wax that makes floors slippery. If you must use wax, use non-skid floor wax. Do not have throw rugs and other things on the floor that can make you trip. What can I do with my stairs? Do not leave any items on the stairs. Make sure that there are handrails on both sides of the stairs and use them. Fix handrails that are broken or loose.  Make sure that handrails are as long as the stairways. Check any carpeting to make sure that it is firmly attached to the stairs. Fix any carpet that is loose or worn. Avoid having throw rugs at the top or bottom of the stairs. If you do have throw rugs, attach them to the floor with carpet tape. Make sure that you have a light switch at the top of the stairs and the bottom of the stairs. If you do not have them, ask someone to add them for you. What else can I do to help prevent falls? Wear shoes that: Do not have high heels. Have rubber bottoms. Are comfortable and fit you well. Are closed at the toe. Do not wear sandals. If you use a stepladder: Make sure that it is fully opened. Do not climb a closed stepladder. Make sure that both sides of the stepladder are locked into place. Ask someone to hold it for you, if possible. Clearly mark and make sure that you can see: Any grab bars or handrails. First and last steps. Where the edge of each step is. Use tools that help you move around (mobility aids) if they are needed. These include: Canes. Walkers. Scooters. Crutches. Turn on the lights when you go into a dark area. Replace any light bulbs as soon as they burn out. Set up your furniture so you have a clear path. Avoid moving your furniture around. If any of your floors are uneven, fix them. If there are any pets around you, be aware of where they are. Review your medicines with your doctor. Some medicines can make you feel dizzy. This can increase your chance of falling. Ask your doctor what other things that you can do to help prevent falls. This information is not intended to replace advice given to you by your health care provider. Make sure you discuss any questions you have with your health care provider. Document Released: 08/24/2009 Document Revised: 04/04/2016 Document Reviewed: 12/02/2014 Elsevier Interactive Patient Education  2017 [employer].

## 2022-08-09 ENCOUNTER — Other Ambulatory Visit: Payer: Self-pay | Admitting: Nurse Practitioner

## 2022-08-09 DIAGNOSIS — I10 Essential (primary) hypertension: Secondary | ICD-10-CM

## 2022-08-16 ENCOUNTER — Telehealth: Payer: Self-pay

## 2022-08-16 NOTE — Telephone Encounter (Signed)
Error

## 2022-08-16 NOTE — Telephone Encounter (Signed)
Patient is calling in checking up on the status of the medication refill.

## 2022-10-22 ENCOUNTER — Other Ambulatory Visit: Payer: Self-pay | Admitting: Nurse Practitioner

## 2022-10-22 DIAGNOSIS — I10 Essential (primary) hypertension: Secondary | ICD-10-CM

## 2022-10-24 ENCOUNTER — Other Ambulatory Visit: Payer: Self-pay | Admitting: Nurse Practitioner

## 2022-10-24 DIAGNOSIS — E1165 Type 2 diabetes mellitus with hyperglycemia: Secondary | ICD-10-CM

## 2022-10-29 ENCOUNTER — Telehealth: Payer: Self-pay | Admitting: Nurse Practitioner

## 2022-10-29 NOTE — Telephone Encounter (Signed)
Patient's son has called about this medication

## 2022-10-29 NOTE — Telephone Encounter (Signed)
Caller & Relationship to patient: Self  Call back number: [phone]   Date of last office visit: 8.25.23  Date of next office visit: 1.25.23  Medication(s) to be refilled:  metFORMIN (GLUCOPHAGE) 500 MG tablet    Preferred Pharmacy:   [id number]   Phone: [phone]  Fax: [phone]

## 2022-10-29 NOTE — Telephone Encounter (Signed)
Patient's son called back stated that its not the metformin that his mother needs a refill on its actually glipiZIDE (GLUCOTROL) 5 MG tablet (Expired) that she needs sent to her pharmacy [id number] - [place], [place] - [address]. Patient's son also is requesting a status update on his mother's prescription of Ozempic. Best callback number is  [phone]

## 2022-10-30 NOTE — Telephone Encounter (Signed)
Medication send on 10/29/22

## 2022-10-31 ENCOUNTER — Telehealth: Payer: Self-pay

## 2022-10-31 NOTE — Telephone Encounter (Signed)
Pt PA was started and send to plan  Key: [id number]) Respond was PA is not needed

## 2022-10-31 NOTE — Telephone Encounter (Signed)
Ozempic PA was done today and pt does not need PA for prescription, provider will be sending in for pt

## 2022-11-20 ENCOUNTER — Other Ambulatory Visit: Payer: Self-pay

## 2022-11-20 DIAGNOSIS — I10 Essential (primary) hypertension: Secondary | ICD-10-CM

## 2022-11-20 DIAGNOSIS — E1165 Type 2 diabetes mellitus with hyperglycemia: Secondary | ICD-10-CM

## 2022-11-20 MED ORDER — OLMESARTAN MEDOXOMIL 20 MG PO TABS: 20.0000 mg | ORAL_TABLET | Freq: Every day | ORAL | 1 refills | Status: DC

## 2022-11-20 MED ORDER — GLIPIZIDE 5 MG PO TABS: 5.0000 mg | ORAL_TABLET | Freq: Two times a day (BID) | ORAL | 0 refills | Status: DC

## 2022-11-24 IMAGING — DX DG KNEE COMPLETE 4+V*L*
4 series · 4 of 4 positions shown · non-contrast
Comparison: None.

CLINICAL DATA: Trip and fall injury.  Left knee pain.

EXAM:
LEFT KNEE - COMPLETE 4+ VIEW

[knee ap]
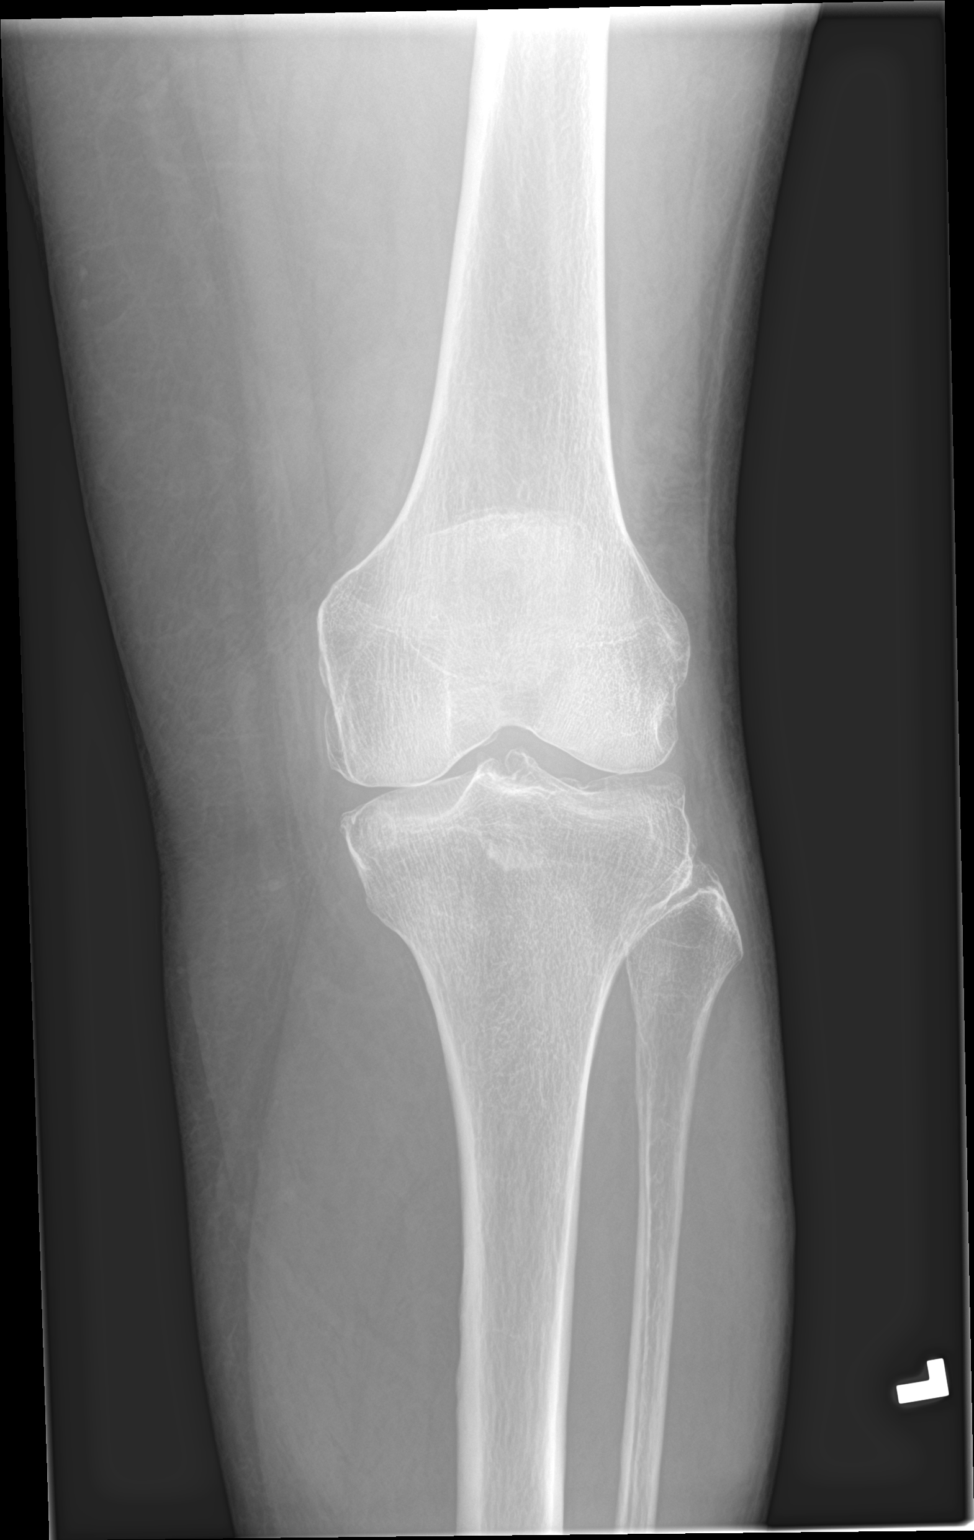

[knee obl (1 of 2)]
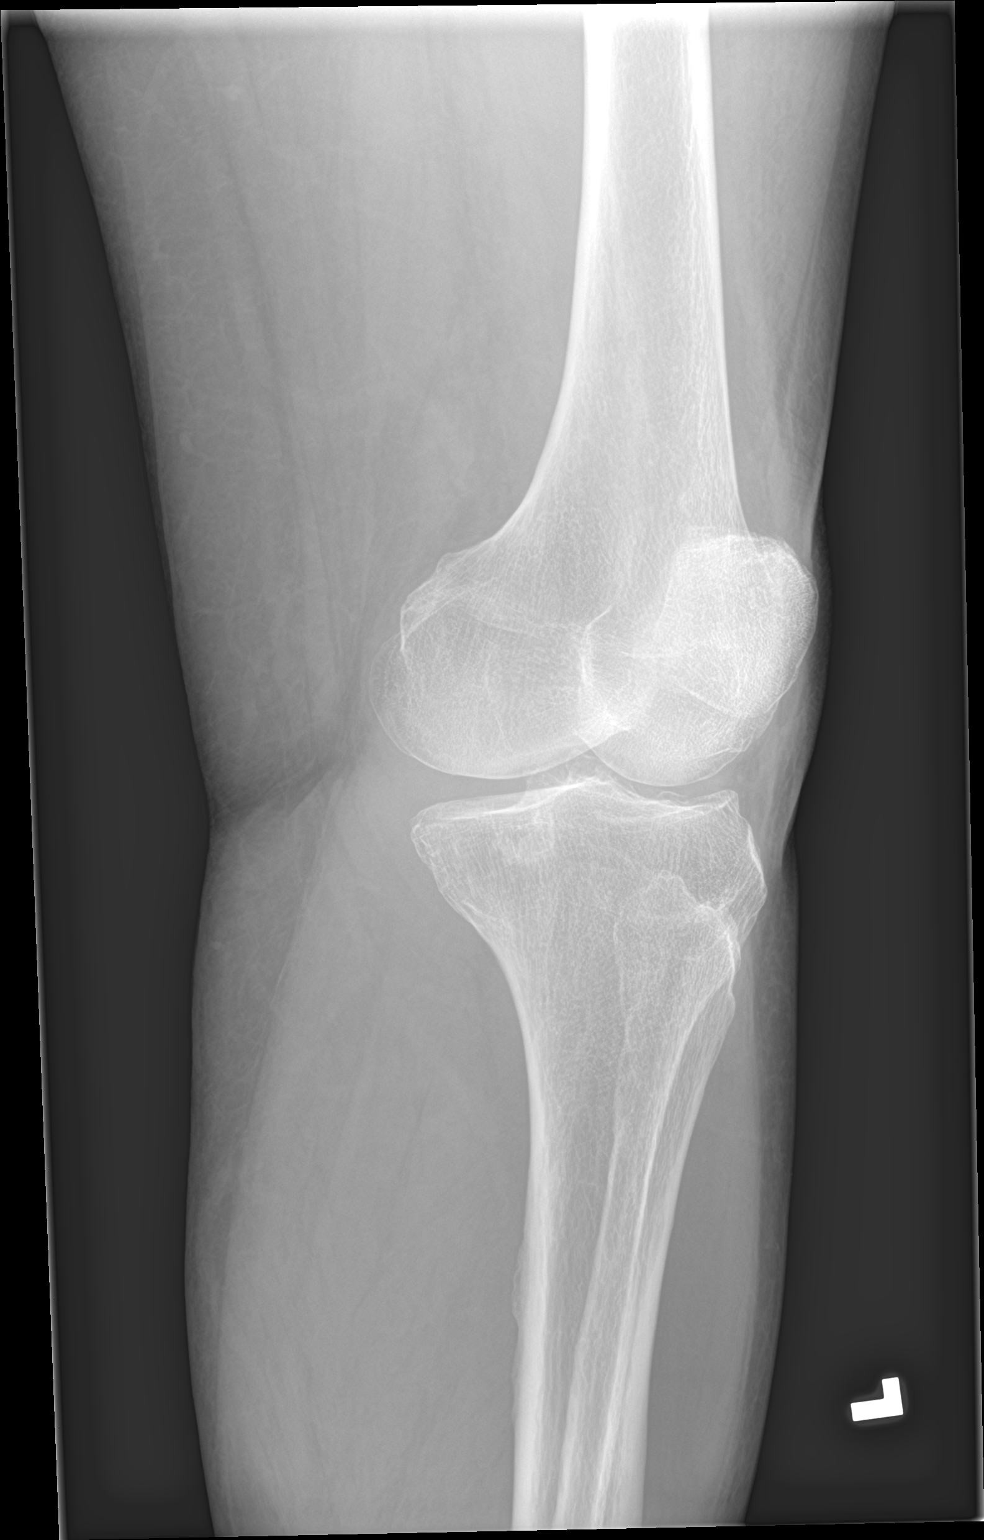

[knee obl (2 of 2)]
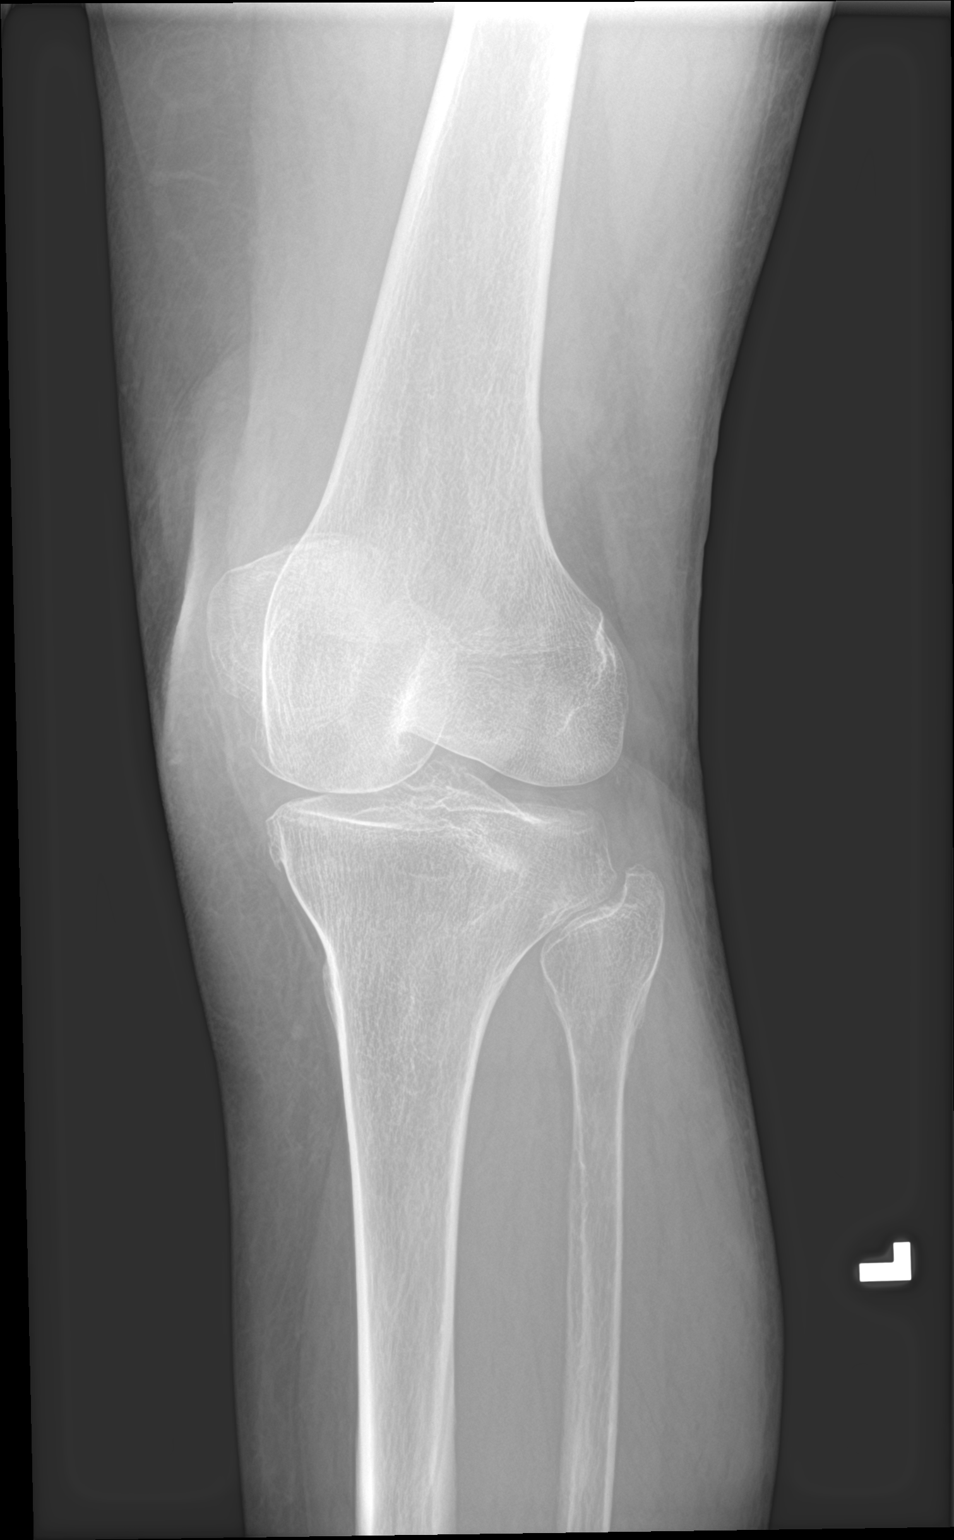

[knee lat]
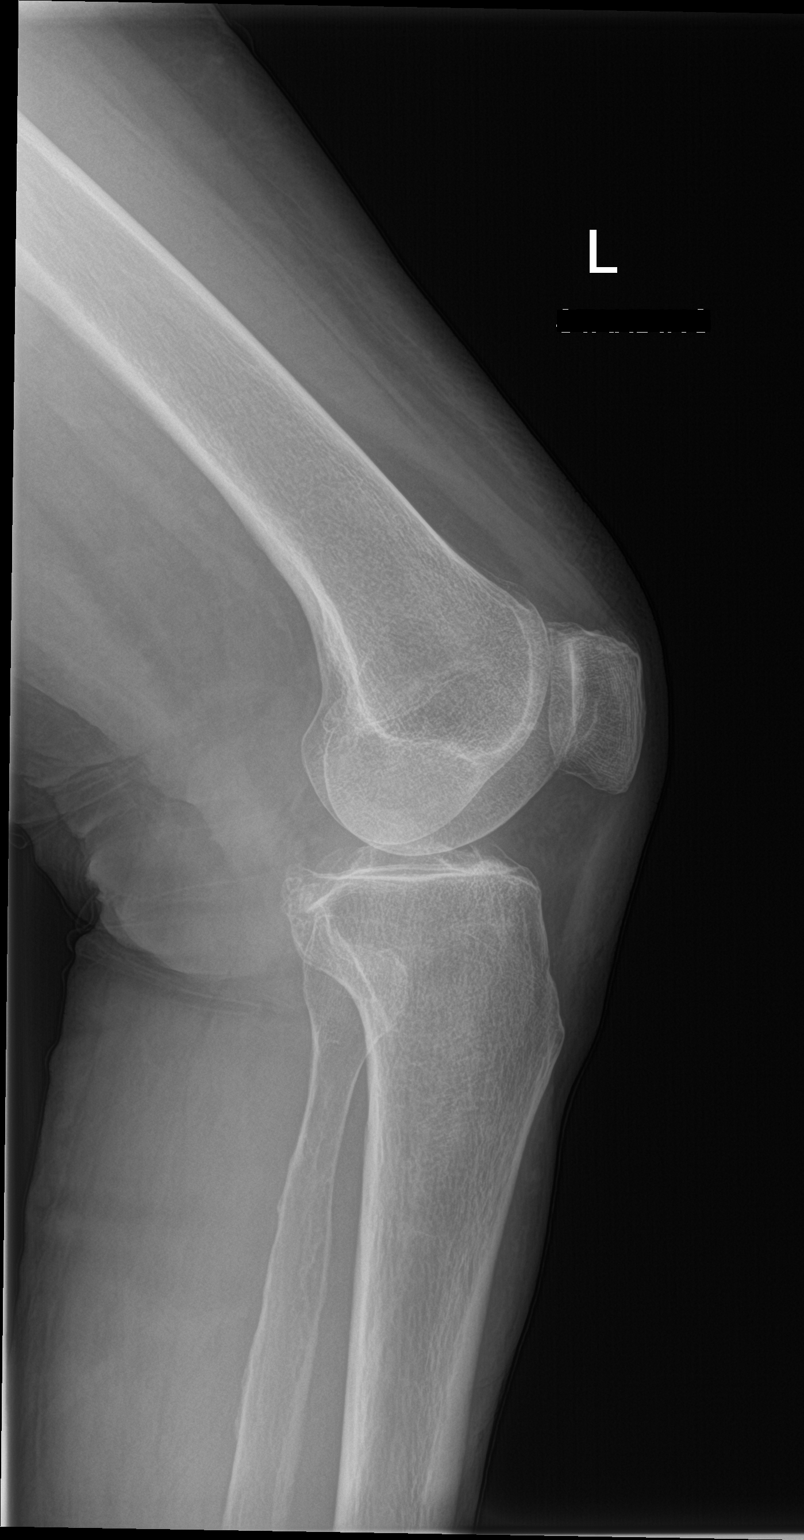

[4 of 4 positions shown; findings below may reference images not displayed]

FINDINGS: No evidence of fracture, dislocation, or joint effusion. No evidence
of arthropathy or other focal bone abnormality. Soft tissues are
unremarkable.
IMPRESSION: Negative.

## 2022-11-25 ENCOUNTER — Telehealth: Payer: Self-pay | Admitting: Nurse Practitioner

## 2022-11-25 NOTE — Telephone Encounter (Signed)
Caller & Relationship to patient: Son  Call back number: [phone]   Date of last office visit: 8.25.23  Date of next office visit: 1.25.24  Medication(s) to be refilled:  glipiZIDE (GLUCOTROL) 5 MG tablet    Preferred Pharmacy:   [facility] [phone]   Phone: [phone]  Fax: [phone]

## 2022-11-27 NOTE — Telephone Encounter (Signed)
Pt glipizide was send to pharmacy on  11/20/22 to [facility]

## 2022-12-05 ENCOUNTER — Ambulatory Visit: Payer: Medicare HMO | Admitting: Nurse Practitioner

## 2022-12-12 ENCOUNTER — Other Ambulatory Visit: Payer: Self-pay | Admitting: Nurse Practitioner

## 2022-12-12 DIAGNOSIS — E1165 Type 2 diabetes mellitus with hyperglycemia: Secondary | ICD-10-CM

## 2022-12-30 ENCOUNTER — Other Ambulatory Visit: Payer: Self-pay

## 2022-12-30 ENCOUNTER — Telehealth: Payer: Self-pay | Admitting: Nurse Practitioner

## 2022-12-30 DIAGNOSIS — E1165 Type 2 diabetes mellitus with hyperglycemia: Secondary | ICD-10-CM

## 2022-12-30 MED ORDER — GLIPIZIDE 5 MG PO TABS: 5.0000 mg | ORAL_TABLET | Freq: Two times a day (BID) | ORAL | 0 refills | Status: DC

## 2022-12-30 NOTE — Telephone Encounter (Signed)
Caller & Relationship to patient: Son  Call back number: [phone]  Date of last office visit: 07/05/22  Date of next office visit: 01/03/23  Medication(s) to be refilled:  glipiZIDE (GLUCOTROL) 5 MG tablet   Preferred Pharmacy: [place], [place] - [address]   Patient only has enough medication for today - has appointment on Friday.  Son is not on DPR and understands that we cannot communicate with him regarding his mother's care until [facility] is filled out. Will get a DPR at this week's visit.

## 2022-12-30 NOTE — Telephone Encounter (Signed)
Medication send to pharmacy, pt has upcoming appt 01/03/23

## 2023-01-03 ENCOUNTER — Ambulatory Visit: Payer: Medicare HMO | Admitting: Nurse Practitioner

## 2023-01-10 ENCOUNTER — Ambulatory Visit: Payer: Medicare HMO | Admitting: Nurse Practitioner

## 2023-01-23 ENCOUNTER — Ambulatory Visit (INDEPENDENT_AMBULATORY_CARE_PROVIDER_SITE_OTHER): Payer: Medicare HMO | Admitting: Nurse Practitioner

## 2023-01-23 DIAGNOSIS — E1165 Type 2 diabetes mellitus with hyperglycemia: Secondary | ICD-10-CM | POA: Diagnosis not present

## 2023-01-23 DIAGNOSIS — H60502 Unspecified acute noninfective otitis externa, left ear: Secondary | ICD-10-CM

## 2023-01-23 DIAGNOSIS — I10 Essential (primary) hypertension: Secondary | ICD-10-CM

## 2023-01-23 DIAGNOSIS — Z1159 Encounter for screening for other viral diseases: Secondary | ICD-10-CM | POA: Diagnosis not present

## 2023-01-23 DIAGNOSIS — E785 Hyperlipidemia, unspecified: Secondary | ICD-10-CM | POA: Diagnosis not present

## 2023-01-23 DIAGNOSIS — E559 Vitamin D deficiency, unspecified: Secondary | ICD-10-CM

## 2023-01-23 DIAGNOSIS — K219 Gastro-esophageal reflux disease without esophagitis: Secondary | ICD-10-CM | POA: Diagnosis not present

## 2023-01-23 DIAGNOSIS — J309 Allergic rhinitis, unspecified: Secondary | ICD-10-CM

## 2023-01-23 DIAGNOSIS — R300 Dysuria: Secondary | ICD-10-CM | POA: Diagnosis not present

## 2023-01-23 MED ORDER — METFORMIN HCL 500 MG PO TABS: 500.0000 mg | ORAL_TABLET | Freq: Two times a day (BID) | ORAL | 2 refills | Status: DC

## 2023-01-23 MED ORDER — ACETIC ACID 2 % OT SOLN: 5.0000 [drp] | Freq: Three times a day (TID) | OTIC | 0 refills | Status: DC

## 2023-01-23 MED ORDER — FAMOTIDINE 10 MG PO TABS: 10.0000 mg | ORAL_TABLET | Freq: Every day | ORAL | 1 refills | Status: AC | PRN

## 2023-01-23 MED ORDER — GLIPIZIDE 5 MG PO TABS: 5.0000 mg | ORAL_TABLET | Freq: Two times a day (BID) | ORAL | 2 refills | Status: DC

## 2023-01-23 NOTE — Assessment & Plan Note (Signed)
Order urinalysis with reflex to culture, further recommendations may be made based upon results.

## 2023-01-23 NOTE — Assessment & Plan Note (Signed)
Check serum vitamin D level and calcium, further recommendations to be made based upon these results.

## 2023-01-23 NOTE — Progress Notes (Signed)
Established Patient Office Visit  Subjective   Patient ID: Sheryl Mcknight, female    DOB: [birth year]  Age: 72 y.o. MRN: [id number]  Chief Complaint  Patient presents with   Diabetes    Type 2 diabetes: Discontinued Ozempic due to nausea.  Continues on glipizide 5 mg twice a day and metformin 500 mg twice a day, tolerating medication well.  Reports recent blood sugars seem to be much better controlled and are in the 100s to 200s.  On ARB.  Not on statin therapy currently.  Hypertension: On amlodipine 10 mg daily and olmesartan 20 mg daily, tolerating well.  Hyperlipidemia: Due for lipid panel check.  Vitamin D deficiency: On vitamin D3 5000 units daily.  Due to have serum checked today.  Allergic rhinitis: Reports her allergies have been more severe recently.  Dysuria: Had some mild dysuria last week.  Drink sugar-free cranberry juice reports symptoms have not resolved.  Would like urinalysis to rule out UTI.  GERD: Reports has been experiencing intermittent reflux.  Has been taking Prilosec over-the-counter as needed with improvement in symptoms.  Left ear pain: Has been ongoing intermittent issue.  Also experiencing itching today.  Health maintenance: Due for hep C screening.    Review of Systems  HENT:  Positive for ear pain. Negative for hearing loss.   Respiratory:  Negative for shortness of breath.   Cardiovascular:  Negative for chest pain.  Gastrointestinal:  Positive for heartburn.      Objective:     There were no vitals taken for this visit. BP Readings from Last 3 Encounters:  07/05/22 138/74  03/22/22 138/78  02/22/22 (!) 170/84   Wt Readings from Last 3 Encounters:  08/05/22 167 lb (75.8 kg)  07/05/22 176 lb 4 oz (79.9 kg)  02/22/22 174 lb (78.9 kg)      Physical Exam Vitals reviewed.  Constitutional:      General: She is not in acute distress.    Appearance: Normal appearance.  HENT:     Head: Normocephalic and atraumatic.     Left Ear:  Hearing and tympanic membrane normal.     Ears:     Comments: Red, swollen ear canal Neck:     Vascular: No carotid bruit.  Cardiovascular:     Rate and Rhythm: Normal rate and regular rhythm.     Pulses: Normal pulses.     Heart sounds: Normal heart sounds.  Pulmonary:     Effort: Pulmonary effort is normal.     Breath sounds: Normal breath sounds.  Skin:    General: Skin is warm and dry.  Neurological:     General: No focal deficit present.     Mental Status: She is alert and oriented to person, place, and time.  Psychiatric:        Mood and Affect: Mood normal.        Behavior: Behavior normal.        Judgment: Judgment normal.      No results found for any visits on 01/23/23.    The ASCVD Risk score (Arnett DK, et al., 2019) failed to calculate for the following reasons:   The systolic blood pressure is missing    Assessment & Plan:   Problem List Items Addressed This Visit       Cardiovascular and Mediastinum   Hypertension    Chronic, Continue olmesartan 20 mg by mouth daily, and amlodipine 10 mg a mouth daily.        Respiratory  Allergic rhinitis - Primary    Referral to allergist for further assistance with management.      Relevant Orders   Ambulatory referral to Allergy   CBC     Digestive   Gastroesophageal reflux disease    Recommend patient take as needed famotidine 10 mg by mouth daily.  If symptoms persist consider daily omeprazole x 6 weeks.  Patient encouraged to call office if unable to manage symptoms with famotidine alone or if symptoms occur more than 3 times a week.      Relevant Medications   famotidine (PEPCID) 10 MG tablet   Other Relevant Orders   CBC     Endocrine   Type 2 diabetes mellitus with hyperglycemia, without long-term current use of insulin (HCC)    Chronic, Check A1c , further recommendations to be made based upon these results.   Patient also to continue olmesartan, metformin, and glipizide.        Relevant  Medications   glipiZIDE (GLUCOTROL) 5 MG tablet   metFORMIN (GLUCOPHAGE) 500 MG tablet   Other Relevant Orders   Hemoglobin [id number]   Basic metabolic panel   Lipid panel   CBC     Nervous and Auditory   Acute otitis externa of left ear    Clinical exam consistent with otitis externa.  Treat with acetic acid eardrops x 1 week, notify office if symptoms persist.      Relevant Medications   acetic acid 2 % otic solution     Other   Vitamin D deficiency disease    Check serum vitamin D level and calcium, further recommendations to be made based upon these results.      Relevant Orders   VITAMIN D 25 Hydroxy (Vit-D Deficiency, Fractures)   HLD (hyperlipidemia)    Lipid panel ordered today, further recommendations to be made based upon results.      Relevant Orders   Lipid panel   CBC   Dysuria    Order urinalysis with reflex to culture, further recommendations may be made based upon results.      Relevant Orders   Urinalysis with Culture, if indicated   CBC   Encounter for hepatitis C screening test for low risk patient    Hep C antibody ordered today, further recommendations to be made based upon results.      Relevant Orders   Hepatitis C antibody    Return in about 3 months (around 04/25/2023) for 3-6 months F/U with Judson Roch.  Total time spent on encounter today was 44 minutes including face-to-face interaction with the patient, review previous records, consultation with pharmacy to help assist with determining appropriate eardrop prescription as patient has multiple allergies to antibiotics, and development/discussion of treatment plan.   Ailene Ards, NP

## 2023-01-23 NOTE — Assessment & Plan Note (Signed)
Recommend patient take as needed famotidine 10 mg by mouth daily.  If symptoms persist consider daily omeprazole x 6 weeks.  Patient encouraged to call office if unable to manage symptoms with famotidine alone or if symptoms occur more than 3 times a week.

## 2023-01-23 NOTE — Assessment & Plan Note (Signed)
Lipid panel ordered today, further recommendations to be made based upon results.

## 2023-01-23 NOTE — Assessment & Plan Note (Signed)
Clinical exam consistent with otitis externa.  Treat with acetic acid eardrops x 1 week, notify office if symptoms persist.

## 2023-01-23 NOTE — Assessment & Plan Note (Signed)
Hep C antibody ordered today, further recommendations to be made based upon results.

## 2023-01-23 NOTE — Assessment & Plan Note (Signed)
Referral to allergist for further assistance with management.

## 2023-01-23 NOTE — Assessment & Plan Note (Signed)
Chronic, Check A1c , further recommendations to be made based upon these results.   Patient also to continue olmesartan, metformin, and glipizide.

## 2023-01-23 NOTE — Assessment & Plan Note (Signed)
Chronic, Continue olmesartan 20 mg by mouth daily, and amlodipine 10 mg a mouth daily.

## 2023-01-24 ENCOUNTER — Other Ambulatory Visit: Payer: Medicare HMO

## 2023-01-24 DIAGNOSIS — R300 Dysuria: Secondary | ICD-10-CM | POA: Diagnosis not present

## 2023-01-24 LAB — URINALYSIS WITH CULTURE, IF INDICATED
Bilirubin Urine: NEGATIVE
Hgb urine dipstick: NEGATIVE
Ketones, ur: NEGATIVE
Nitrite: NEGATIVE
Specific Gravity, Urine: <=1.005 — AB (ref 1.000–1.030)
Total Protein, Urine: NEGATIVE
Urine Glucose: NEGATIVE
Urobilinogen, UA: 0.2 (ref 0.0–1.0)
pH: 6 (ref 5.0–8.0)

## 2023-01-24 LAB — CBC
HCT: 41.8 % (ref 36.0–46.0)
Hemoglobin: 14.4 g/dL (ref 12.0–15.0)
MCHC: 34.4 g/dL (ref 30.0–36.0)
MCV: 87.3 fl (ref 78.0–100.0)
Platelets: 236 10*3/uL (ref 150.0–400.0)
RBC: 4.78 Mil/uL (ref 3.87–5.11)
RDW: 12.3 % (ref 11.5–15.5)
WBC: 7.4 10*3/uL (ref 4.0–10.5)

## 2023-01-24 LAB — BASIC METABOLIC PANEL
BUN: 15 mg/dL (ref 6–23)
CO2: 29 mEq/L (ref 19–32)
Calcium: 9.9 mg/dL (ref 8.4–10.5)
Chloride: 100 mEq/L (ref 96–112)
Creatinine, Ser: 0.72 mg/dL (ref 0.40–1.20)
GFR: 84.98 mL/min (ref >60.00)
Glucose, Bld: 138 mg/dL — ABNORMAL HIGH (ref 70–99)
Potassium: 4.1 mEq/L (ref 3.5–5.1)
Sodium: 138 mEq/L (ref 135–145)

## 2023-01-24 LAB — VITAMIN D 25 HYDROXY (VIT D DEFICIENCY, FRACTURES): VITD: 60.7 ng/mL (ref 30.00–100.00)

## 2023-01-24 LAB — LIPID PANEL
Cholesterol: 136 mg/dL (ref 0–200)
HDL: 39.5 mg/dL (ref >39.00)
LDL Cholesterol: 60 mg/dL (ref 0–99)
NonHDL: 96.63
Total CHOL/HDL Ratio: 3
Triglycerides: 183 mg/dL — ABNORMAL HIGH (ref 0.0–149.0)
VLDL: 36.6 mg/dL (ref 0.0–40.0)

## 2023-01-24 LAB — HEPATITIS C ANTIBODY: Hepatitis C Ab: NONREACTIVE

## 2023-01-24 LAB — HEMOGLOBIN A1C: Hgb A1c MFr Bld: 8 % — ABNORMAL HIGH (ref 4.6–6.5)

## 2023-01-26 LAB — URINE CULTURE
MICRO NUMBER:: 14698692
SPECIMEN QUALITY:: ADEQUATE

## 2023-01-27 ENCOUNTER — Other Ambulatory Visit: Payer: Self-pay | Admitting: Nurse Practitioner

## 2023-01-27 DIAGNOSIS — N390 Urinary tract infection, site not specified: Secondary | ICD-10-CM

## 2023-01-27 MED ORDER — AMOXICILLIN-POT CLAVULANATE 500-125 MG PO TABS: 1.0000 | ORAL_TABLET | Freq: Two times a day (BID) | ORAL | 0 refills | Status: DC

## 2023-01-27 NOTE — Progress Notes (Signed)
Estimated Creatinine Clearance: 70.5 mL/min (by C-G formula based on SCr of 0.72 mg/dL).

## 2023-02-10 ENCOUNTER — Other Ambulatory Visit: Payer: Self-pay | Admitting: Nurse Practitioner

## 2023-02-10 DIAGNOSIS — I10 Essential (primary) hypertension: Secondary | ICD-10-CM

## 2023-04-14 DIAGNOSIS — R300 Dysuria: Secondary | ICD-10-CM | POA: Diagnosis not present

## 2023-04-14 DIAGNOSIS — N390 Urinary tract infection, site not specified: Secondary | ICD-10-CM | POA: Diagnosis not present

## 2023-04-14 DIAGNOSIS — Z6824 Body mass index (BMI) 24.0-24.9, adult: Secondary | ICD-10-CM | POA: Diagnosis not present

## 2023-05-14 ENCOUNTER — Other Ambulatory Visit: Payer: Self-pay | Admitting: Nurse Practitioner

## 2023-05-14 DIAGNOSIS — I10 Essential (primary) hypertension: Secondary | ICD-10-CM

## 2023-08-07 ENCOUNTER — Other Ambulatory Visit: Payer: Self-pay | Admitting: Nurse Practitioner

## 2023-08-07 DIAGNOSIS — I10 Essential (primary) hypertension: Secondary | ICD-10-CM

## 2023-08-13 ENCOUNTER — Other Ambulatory Visit: Payer: Self-pay | Admitting: Nurse Practitioner

## 2023-08-13 DIAGNOSIS — I10 Essential (primary) hypertension: Secondary | ICD-10-CM

## 2023-09-30 ENCOUNTER — Telehealth: Payer: Self-pay

## 2023-09-30 NOTE — Patient Outreach (Signed)
Attempted to contact patient regarding MM, DM eye, Colorectal exam. Left voicemail for patient to return my call at [phone].  Nicholes Rough, CMA Care Guide [employer]

## 2023-10-12 ENCOUNTER — Other Ambulatory Visit: Payer: Self-pay | Admitting: Nurse Practitioner

## 2023-10-12 DIAGNOSIS — E1165 Type 2 diabetes mellitus with hyperglycemia: Secondary | ICD-10-CM

## 2023-10-23 ENCOUNTER — Ambulatory Visit: Payer: Medicare HMO | Admitting: Nurse Practitioner

## 2023-10-23 VITALS — BP 116/62 | HR 84 | Temp 98.0°F | Ht 67.0 in | Wt 171.2 lb

## 2023-10-23 DIAGNOSIS — I10 Essential (primary) hypertension: Secondary | ICD-10-CM | POA: Diagnosis not present

## 2023-10-23 DIAGNOSIS — E1165 Type 2 diabetes mellitus with hyperglycemia: Secondary | ICD-10-CM | POA: Diagnosis not present

## 2023-10-23 DIAGNOSIS — E785 Hyperlipidemia, unspecified: Secondary | ICD-10-CM

## 2023-10-23 DIAGNOSIS — Z7984 Long term (current) use of oral hypoglycemic drugs: Secondary | ICD-10-CM | POA: Diagnosis not present

## 2023-10-23 DIAGNOSIS — J019 Acute sinusitis, unspecified: Secondary | ICD-10-CM

## 2023-10-23 MED ORDER — AMLODIPINE BESYLATE 5 MG PO TABS: 5.0000 mg | ORAL_TABLET | Freq: Every day | ORAL | 2 refills | Status: DC

## 2023-10-23 MED ORDER — AZITHROMYCIN 250 MG PO TABS: ORAL_TABLET | ORAL | 0 refills | Status: AC

## 2023-10-23 NOTE — Assessment & Plan Note (Addendum)
Acute Offered POC testing for RSV, covid, flu but patient declined.  Will prescribe Z-Pak the patient can take if symptoms persist for an additional week or worsen between now and then.

## 2023-10-23 NOTE — Patient Instructions (Signed)
Call office and let me know if your blood pressure is consistently >140/>90.

## 2023-10-23 NOTE — Assessment & Plan Note (Signed)
Chronic, unclear whether or not last lipid panel was done in a fasting state.  Based on time level was drawn I think is unlikely is fasting.  Will repeat fasting lipid panel at lab closer to her home as lab was closed at the time of this visit.  Further recommendations may be made based upon the results.  Patient does seem hesitant to start statin therapy but may consider it pending lab results.

## 2023-10-23 NOTE — Assessment & Plan Note (Addendum)
Chronic, well-controlled Patient reports some lightheadedness.  Will trial lower dose of amlodipine from 10 mg to 5 mg daily but continue on olmesartan 20 mg daily.  Follow-up in 3 months or sooner as needed.  Patient is agreeable to monitoring blood pressure at home will call office if blood pressure is trending upward.

## 2023-10-23 NOTE — Progress Notes (Addendum)
Established Patient Office Visit  Subjective   Patient ID: Sheryl Mcknight, female    DOB: 1951-03-17  Age: 72 y.o. MRN: [id number]  Chief Complaint  Patient presents with   Sinusitis    Patient arrives today for routine follow-up but also for acute visit.  Reports over the last 3 days she has been experiencing cough, sneezing, congestion, chills.  She also reports that she was in having some dizziness and lightheadedness especially with position changes.  Denies any syncope.  She also has type 2 diabetes with A1c above goal.  Due to have A1c rechecked.  Continues on glipizide 5 mg twice daily and metformin 500 mg twice daily.  Tolerating well.  On olmesartan.  Not on cholesterol-lowering medication.  Last LDL 60, however triglycerides elevated at 183, however this was likely not a fasting level.  Patient is due for breast cancer screening and colon cancer screening, however she has declined screenings to be completed at this time.    Review of Systems  Constitutional:  Positive for chills. Negative for fever.  HENT:  Positive for congestion.        (+) sneezing  Respiratory:  Positive for cough.   Cardiovascular:  Negative for chest pain.  Neurological:  Positive for headaches.      Objective:     BP 116/62 (Cuff Size: Large)   Pulse 84   Temp 98 F (36.7 C) (Temporal)   Ht 5\' 7"  (1.702 m)   Wt 171 lb 4 oz (77.7 kg)   SpO2 96%   BMI 26.82 kg/m  BP Readings from Last 3 Encounters:  10/23/23 116/62  07/05/22 138/74  03/22/22 138/78   Wt Readings from Last 3 Encounters:  10/23/23 171 lb 4 oz (77.7 kg)  08/05/22 167 lb (75.8 kg)  07/05/22 176 lb 4 oz (79.9 kg)      Physical Exam Vitals reviewed.  Constitutional:      General: She is not in acute distress.    Appearance: Normal appearance.  HENT:     Head: Normocephalic and atraumatic.  Cardiovascular:     Rate and Rhythm: Normal rate and regular rhythm.     Pulses: Normal pulses.     Heart sounds: Normal  heart sounds.  Pulmonary:     Effort: Pulmonary effort is normal.     Breath sounds: Normal breath sounds.  Skin:    General: Skin is warm and dry.  Neurological:     General: No focal deficit present.     Mental Status: She is alert and oriented to person, place, and time.  Psychiatric:        Mood and Affect: Mood normal.        Behavior: Behavior normal.        Judgment: Judgment normal.    Diabetic Foot Exam - Simple   Simple Foot Form Diabetic Foot exam was performed with the following findings: Yes 10/23/2023  5:00 PM  Visual Inspection No deformities, no ulcerations, no other skin breakdown bilaterally: Yes Sensation Testing Intact to touch and monofilament testing bilaterally: Yes Pulse Check Posterior Tibialis and Dorsalis pulse intact bilaterally: Yes Comments       No results found for any visits on 10/23/23.    The 10-year ASCVD risk score (Arnett DK, et al., 2019) is: 18.3%    Assessment & Plan:   Problem List Items Addressed This Visit       Cardiovascular and Mediastinum   Hypertension   Chronic, well-controlled Patient reports  some lightheadedness.  Will trial lower dose of amlodipine from 10 mg to 5 mg daily but continue on olmesartan 20 mg daily.  Follow-up in 3 months or sooner as needed.  Patient is agreeable to monitoring blood pressure at home will call office if blood pressure is trending upward.      Relevant Medications   amLODipine (NORVASC) 5 MG tablet   Other Relevant Orders   CBC   Comprehensive metabolic panel   Hemoglobin A1c   Lipid panel   TSH   Microalbumin / creatinine urine ratio     Respiratory   Acute non-recurrent sinusitis - Primary   Acute Offered POC testing for RSV, covid, flu but patient declined.  Will prescribe Z-Pak the patient can take if symptoms persist for an additional week or worsen between now and then.      Relevant Medications   azithromycin (ZITHROMAX) 250 MG tablet   Other Relevant Orders    CBC   Comprehensive metabolic panel   Hemoglobin A1c   Lipid panel   TSH   Microalbumin / creatinine urine ratio     Endocrine   Type 2 diabetes mellitus with hyperglycemia, without long-term current use of insulin (HCC)   Relevant Orders   CBC   Comprehensive metabolic panel   Hemoglobin A1c   Lipid panel   TSH   Microalbumin / creatinine urine ratio     Other   HLD (hyperlipidemia)   Chronic, unclear whether or not last lipid panel was done in a fasting state.  Based on time level was drawn I think is unlikely is fasting.  Will repeat fasting lipid panel at lab closer to her home as lab was closed at the time of this visit.  Further recommendations may be made based upon the results.  Patient does seem hesitant to start statin therapy but may consider it pending lab results.      Relevant Medications   amLODipine (NORVASC) 5 MG tablet    Return in about 3 months (around 01/21/2024) for F/U with Maralyn Sago.  Total time spent on encounter today was 30 minutes including face-to-face evaluation, review previous records, and development/discussion of treatment plan.   Elenore Paddy, NP

## 2023-11-07 ENCOUNTER — Other Ambulatory Visit: Payer: Self-pay | Admitting: Nurse Practitioner

## 2023-11-07 DIAGNOSIS — I10 Essential (primary) hypertension: Secondary | ICD-10-CM

## 2023-11-10 ENCOUNTER — Other Ambulatory Visit: Payer: Self-pay | Admitting: Nurse Practitioner

## 2023-11-10 DIAGNOSIS — J019 Acute sinusitis, unspecified: Secondary | ICD-10-CM | POA: Diagnosis not present

## 2023-11-10 DIAGNOSIS — E1165 Type 2 diabetes mellitus with hyperglycemia: Secondary | ICD-10-CM | POA: Diagnosis not present

## 2023-11-10 DIAGNOSIS — I10 Essential (primary) hypertension: Secondary | ICD-10-CM | POA: Diagnosis not present

## 2023-11-11 LAB — HEMOGLOBIN A1C
Hgb A1c MFr Bld: 7 %{Hb} — ABNORMAL HIGH (ref <5.7)
Mean Plasma Glucose: 154 mg/dL
eAG (mmol/L): 8.5 mmol/L

## 2023-11-11 LAB — CBC
HCT: 41 % (ref 35.0–45.0)
Hemoglobin: 14.3 g/dL (ref 11.7–15.5)
MCH: 29.7 pg (ref 27.0–33.0)
MCHC: 34.9 g/dL (ref 32.0–36.0)
MCV: 85.1 fL (ref 80.0–100.0)
MPV: 10.8 fL (ref 7.5–12.5)
Platelets: 232 10*3/uL (ref 140–400)
RBC: 4.82 10*6/uL (ref 3.80–5.10)
RDW: 11.9 % (ref 11.0–15.0)
WBC: 6.8 10*3/uL (ref 3.8–10.8)

## 2023-11-11 LAB — LIPID PANEL
Cholesterol: 126 mg/dL (ref <200)
HDL: 38 mg/dL — ABNORMAL LOW (ref >=50)
LDL Cholesterol (Calc): 65 mg/dL
Non-HDL Cholesterol (Calc): 88 mg/dL (ref <130)
Total CHOL/HDL Ratio: 3.3 (calc) (ref <5.0)
Triglycerides: 152 mg/dL — ABNORMAL HIGH (ref <150)

## 2023-11-11 LAB — COMPREHENSIVE METABOLIC PANEL
AG Ratio: 1.2 (calc) (ref 1.0–2.5)
ALT: 24 U/L (ref 6–29)
AST: 23 U/L (ref 10–35)
Albumin: 4.3 g/dL (ref 3.6–5.1)
Alkaline phosphatase (APISO): 97 U/L (ref 37–153)
BUN: 19 mg/dL (ref 7–25)
CO2: 28 mmol/L (ref 20–32)
Calcium: 9.3 mg/dL (ref 8.6–10.4)
Chloride: 101 mmol/L (ref 98–110)
Creat: 0.81 mg/dL (ref 0.60–1.00)
Globulin: 3.7 g/dL (ref 1.9–3.7)
Glucose, Bld: 132 mg/dL — ABNORMAL HIGH (ref 65–99)
Potassium: 5 mmol/L (ref 3.5–5.3)
Sodium: 136 mmol/L (ref 135–146)
Total Bilirubin: 0.4 mg/dL (ref 0.2–1.2)
Total Protein: 8 g/dL (ref 6.1–8.1)

## 2023-11-11 LAB — MICROALBUMIN / CREATININE URINE RATIO
Creatinine, Urine: 84 mg/dL (ref 20–275)
Microalb Creat Ratio: 43 mg/g{creat} — ABNORMAL HIGH (ref <30)
Microalb, Ur: 3.6 mg/dL

## 2023-11-11 LAB — TSH: TSH: 0.34 m[IU]/L — ABNORMAL LOW (ref 0.40–4.50)

## 2023-12-24 ENCOUNTER — Other Ambulatory Visit: Payer: Self-pay | Admitting: Nurse Practitioner

## 2023-12-24 DIAGNOSIS — E1165 Type 2 diabetes mellitus with hyperglycemia: Secondary | ICD-10-CM

## 2024-01-22 ENCOUNTER — Ambulatory Visit: Payer: Medicare HMO | Admitting: Nurse Practitioner

## 2024-01-30 ENCOUNTER — Encounter: Payer: Self-pay | Admitting: Nurse Practitioner

## 2024-02-07 ENCOUNTER — Other Ambulatory Visit: Payer: Self-pay | Admitting: Nurse Practitioner

## 2024-02-07 DIAGNOSIS — I10 Essential (primary) hypertension: Secondary | ICD-10-CM

## 2024-02-16 ENCOUNTER — Ambulatory Visit (INDEPENDENT_AMBULATORY_CARE_PROVIDER_SITE_OTHER)

## 2024-02-16 VITALS — Ht 67.0 in | Wt 171.0 lb

## 2024-02-16 DIAGNOSIS — Z0000 Encounter for general adult medical examination without abnormal findings: Secondary | ICD-10-CM

## 2024-02-16 NOTE — Progress Notes (Signed)
 Subjective:   Sheryl Mcknight is a 73 y.o. who presents for a Medicare Wellness preventive visit.  Visit Complete: Virtual I connected with  Sheryl Mcknight on 02/16/24 by a audio enabled telemedicine application and verified that I am speaking with the correct person using two identifiers.  Patient Location: Home  Provider Location: Home Office  I discussed the limitations of evaluation and management by telemedicine. The patient expressed understanding and agreed to proceed.  Vital Signs: Because this visit was a virtual/telehealth visit, some criteria may be missing or patient reported. Any vitals not documented were not able to be obtained and vitals that have been documented are patient reported.  VideoDeclined- This patient declined Librarian, academic. Therefore the visit was completed with audio only.  Persons Participating in Visit: Patient.  AWV Questionnaire: No: Patient Medicare AWV questionnaire was not completed prior to this visit.  Cardiac Risk Factors include: advanced age (>95men, >65 women);dyslipidemia;diabetes mellitus;hypertension     Objective:    Today's Vitals   02/16/24 1341  Weight: 171 lb (77.6 kg)  Height: 5\' 7"  (1.702 m)   Body mass index is 26.78 kg/m.     02/16/2024    1:52 PM 08/05/2022    9:00 AM 05/29/2021    1:26 PM 04/26/2021   12:22 AM  Advanced Directives  Does Patient Have a Medical Advance Directive? No No No No  Would patient like information on creating a medical advance directive?  No - Patient declined No - Patient declined No - Patient declined    Current Medications (verified) Outpatient Encounter Medications as of 02/16/2024  Medication Sig   amLODipine (NORVASC) 5 MG tablet Take 1 tablet (5 mg total) by mouth daily.   Cholecalciferol (VITAMIN D-3) 125 MCG (5000 UT) TABS Take 1 tablet by mouth daily.   famotidine (PEPCID) 10 MG tablet Take 1 tablet (10 mg total) by mouth daily as needed for heartburn  or indigestion.   glipiZIDE (GLUCOTROL) 5 MG tablet TAKE 1 TABLET BY MOUTH TWICE DAILY. OVERDUE FOR FOLLOW UP APPT. PLEASE CALL AND SCHEDULE FOR ADDITIONAL REFILLS   metFORMIN (GLUCOPHAGE) 500 MG tablet TAKE 1 TABLET BY MOUTH TWICE DAILY WITH A MEAL   nystatin cream (MYCOSTATIN) Apply 1 application topically 2 (two) times daily.   olmesartan (BENICAR) 20 MG tablet Take 1 tablet by mouth once daily   No facility-administered encounter medications on file as of 02/16/2024.    Allergies (verified) Ozempic (0.25 or 0.5 mg-dose) [semaglutide(0.25 or 0.5mg -dos)], Penicillins, Sulfa antibiotics, and Ciprofloxacin   History: Past Medical History:  Diagnosis Date   Diabetes mellitus without complication (HCC)    Hypertension    Vitamin D deficiency disease    Past Surgical History:  Procedure Laterality Date   CESAREAN SECTION     History reviewed. No pertinent family history. Social History   Socioeconomic History   Marital status: Widowed    Spouse name: Not on file   Number of children: 3   Years of education: Not on file   Highest education level: Not on file  Occupational History   Occupation: Retired  Tobacco Use   Smoking status: Never   Smokeless tobacco: Never  Vaping Use   Vaping status: Never Used  Substance and Sexual Activity   Alcohol use: No   Drug use: No    Frequency: 3.0 times per week   Sexual activity: Not on file  Other Topics Concern   Not on file  Social History Narrative  Widow,husband murdered 20 yrs ago.Lives with 3 sons.Retired-previously worked in [employer] .      Lives her 3 sons   Social Drivers of Corporate investment banker Strain: Low Risk  (02/16/2024)   Overall Financial Resource Strain (CARDIA)    Difficulty of Paying Living Expenses: Not hard at all  Food Insecurity: Food Insecurity Present (02/16/2024)   Hunger Vital Sign    Worried About Running Out of Food in the Last Year: Sometimes true    Ran Out of Food in the Last Year: Sometimes  true  Transportation Needs: No Transportation Needs (02/16/2024)   PRAPARE - Administrator, Civil Service (Medical): No    Lack of Transportation (Non-Medical): No  Physical Activity: Insufficiently Active (02/16/2024)   Exercise Vital Sign    Days of Exercise per Week: 4 days    Minutes of Exercise per Session: 30 min  Stress: No Stress Concern Present (02/16/2024)   [employer] of Occupational Health - Occupational Stress Questionnaire    Feeling of Stress : Not at all  Social Connections: Socially Isolated (02/16/2024)   Social Connection and Isolation Panel [NHANES]    Frequency of Communication with Friends and Family: More than three times a week    Frequency of Social Gatherings with Friends and Family: More than three times a week    Attends Religious Services: Never    Database administrator or Organizations: No    Attends Banker Meetings: Never    Marital Status: Widowed    Tobacco Counseling Counseling given: Not Answered    Clinical Intake:  Pre-visit preparation completed: Yes  Pain : No/denies pain     BMI - recorded: 26.78 Nutritional Status: BMI 25 -29 Overweight Nutritional Risks: None Diabetes: Yes CBG done?: Yes (124- per pt) CBG resulted in Enter/ Edit results?: No Did pt. bring in CBG monitor from home?: No  Lab Results  Component Value Date   HGBA1C 7.0 (H) 11/10/2023   HGBA1C 8.0 (H) 01/23/2023   HGBA1C 9.8 (H) 07/05/2022     How often do you need to have someone help you when you read instructions, pamphlets, or other written materials from your doctor or pharmacy?: 1 - Never  Interpreter Needed?: No  Information entered by :: Shayda Kalka, RMA   Activities of Daily Living     02/16/2024    1:49 PM  In your present state of health, do you have any difficulty performing the following activities:  Hearing? 0  Vision? 0  Difficulty concentrating or making decisions? 0  Walking or climbing stairs? 0  Dressing  or bathing? 0  Doing errands, shopping? 0  Preparing Food and eating ? N  Using the Toilet? N  In the past six months, have you accidently leaked urine? N  Do you have problems with loss of bowel control? N  Managing your Medications? N  Managing your Finances? N  Housekeeping or managing your Housekeeping? N    Patient Care Team: Elenore Paddy, NP as PCP - General (Nurse Practitioner)  Indicate any recent Medical Services you may have received from other than [facility] providers in the past year (date may be approximate).     Assessment:   This is a routine wellness examination for Latoyia.  Hearing/Vision screen Hearing Screening - Comments:: Denies hearing difficulties   Vision Screening - Comments:: Denies vision issues.    Goals Addressed  This Visit's Progress     Patient Stated (pt-stated)        Not at this time.       Depression Screen     02/16/2024    1:56 PM 01/23/2023    3:45 PM 08/05/2022    8:58 AM 07/05/2022    3:58 PM 01/04/2022   10:07 AM 05/29/2021    1:08 PM 01/30/2021    3:34 PM  PHQ 2/9 Scores  PHQ - 2 Score 2 0 0 0 0 0 0  PHQ- 9 Score 3  0 0  0 0    Fall Risk     02/16/2024    1:52 PM 01/23/2023    3:45 PM 08/05/2022    8:53 AM 07/05/2022    3:58 PM 01/04/2022   10:07 AM  Fall Risk   Falls in the past year? 1 0 0 0 0  Number falls in past yr: 0 0 0 0 0  Injury with Fall? 0 0 0 0 0  Risk for fall due to :  No Fall Risks No Fall Risks No Fall Risks   Follow up Falls evaluation completed;Falls prevention discussed Falls evaluation completed Falls prevention discussed Falls evaluation completed     MEDICARE RISK AT HOME:  Medicare Risk at Home Any stairs in or around the home?: Yes (outside) If so, are there any without handrails?: Yes Home free of loose throw rugs in walkways, pet beds, electrical cords, etc?: Yes Adequate lighting in your home to reduce risk of falls?: Yes Life alert?: No Use of a cane, walker or w/c?:  No Grab bars in the bathroom?: No Shower chair or bench in shower?: Yes Elevated toilet seat or a handicapped toilet?: Yes  TIMED UP AND GO:  Was the test performed?  No  Cognitive Function: Declined: Patient declined cognitive screening, but was able to answer questions in an accurate and timely manner. No cognitive impairments observed.        08/05/2022    9:01 AM 05/29/2021    1:28 PM  6CIT Screen  What Year? 0 points 0 points  What month? 0 points 0 points  What time? 0 points 0 points  Count back from 20 0 points 0 points  Months in reverse 0 points 0 points  Repeat phrase 2 points 2 points  Total Score 2 points 2 points    Immunizations Immunization History  Administered Date(s) Administered   Fluad Quad(high Dose 65+) 10/14/2019, 10/19/2020, 01/04/2022   Influenza Inj Mdck Quad With Preservative 08/26/2018   Moderna Sars-Covid-2 Vaccination 07/16/2020, 08/13/2020   Pneumococcal Conjugate-13 08/26/2018   Pneumococcal Polysaccharide-23 10/14/2019    Screening Tests Health Maintenance  Topic Date Due   OPHTHALMOLOGY EXAM  Never done   DTaP/Tdap/Td (1 - Tdap) Never done   Colonoscopy  Never done   MAMMOGRAM  Never done   Zoster Vaccines- Shingrix (1 of 2) Never done   DEXA SCAN  Never done   COVID-19 Vaccine (3 - 2024-25 season) 07/13/2023   Medicare Annual Wellness (AWV)  08/06/2023   HEMOGLOBIN A1C  05/10/2024   INFLUENZA VACCINE  06/11/2024   FOOT EXAM  10/22/2024   Diabetic kidney evaluation - eGFR measurement  11/09/2024   Diabetic kidney evaluation - Urine ACR  11/09/2024   Pneumonia Vaccine 1+ Years old  Completed   Hepatitis C Screening  Completed   HPV VACCINES  Aged Out    Health Maintenance  Health Maintenance Due  Topic Date Due  OPHTHALMOLOGY EXAM  Never done   DTaP/Tdap/Td (1 - Tdap) Never done   Colonoscopy  Never done   MAMMOGRAM  Never done   Zoster Vaccines- Shingrix (1 of 2) Never done   DEXA SCAN  Never done   COVID-19 Vaccine  (3 - 2024-25 season) 07/13/2023   Medicare Annual Wellness (AWV)  08/06/2023   Health Maintenance Items Addressed: See Nurse Notes  Additional Screening:  Vision Screening: Recommended annual ophthalmology exams for early detection of glaucoma and other disorders of the eye.  Dental Screening: Recommended annual dental exams for proper oral hygiene  Community Resource Referral / Chronic Care Management: CRR required this visit?  No   CCM required this visit?  No     Plan:     I have personally reviewed and noted the following in the patient's chart:   Medical and social history Use of alcohol, tobacco or illicit drugs  Current medications and supplements including opioid prescriptions. Patient is not currently taking opioid prescriptions. Functional ability and status Nutritional status Physical activity Advanced directives List of other physicians Hospitalizations, surgeries, and ER visits in previous 12 months Vitals Screenings to include cognitive, depression, and falls Referrals and appointments  In addition, I have reviewed and discussed with patient certain preventive protocols, quality metrics, and best practice recommendations. A written personalized care plan for preventive services as well as general preventive health recommendations were provided to patient.     Ugochi Henzler L Anureet Bruington, CMA   02/16/2024   After Visit Summary: (MyChart) Due to this being a telephonic visit, the after visit summary with patients personalized plan was offered to patient via MyChart   Notes: Nothing significant to report at this time.

## 2024-02-16 NOTE — Patient Instructions (Signed)
 Sheryl Mcknight , Thank you for taking time to come for your Medicare Wellness Visit. I appreciate your ongoing commitment to your health goals. Please review the following plan we discussed and let me know if I can assist you in the future.   Referrals/Orders/Follow-Ups/Clinician Recommendations: It was nice talking with you today.    This is a list of the screening recommended for you and due dates:  Health Maintenance  Topic Date Due   Eye exam for diabetics  Never done   DTaP/Tdap/Td vaccine (1 - Tdap) Never done   Colon Cancer Screening  Never done   Mammogram  Never done   Zoster (Shingles) Vaccine (1 of 2) Never done   DEXA scan (bone density measurement)  Never done   COVID-19 Vaccine (3 - 2024-25 season) 07/13/2023   Hemoglobin A1C  05/10/2024   Flu Shot  06/11/2024   Complete foot exam   10/22/2024   Yearly kidney function blood test for diabetes  11/09/2024   Yearly kidney health urinalysis for diabetes  11/09/2024   Medicare Annual Wellness Visit  02/15/2025   Pneumonia Vaccine  Completed   Hepatitis C Screening  Completed   HPV Vaccine  Aged Out    Advanced directives: (Declined) Advance directive discussed with you today. Even though you declined this today, please call our office should you change your mind, and we can give you the proper paperwork for you to fill out.  Next Medicare Annual Wellness Visit scheduled for next year: Yes

## 2024-02-26 ENCOUNTER — Ambulatory Visit (INDEPENDENT_AMBULATORY_CARE_PROVIDER_SITE_OTHER): Admitting: Nurse Practitioner

## 2024-02-26 VITALS — BP 152/84 | HR 93 | Temp 98.6°F | Ht 67.0 in | Wt 166.5 lb

## 2024-02-26 DIAGNOSIS — Z0001 Encounter for general adult medical examination with abnormal findings: Secondary | ICD-10-CM | POA: Diagnosis not present

## 2024-02-26 DIAGNOSIS — J019 Acute sinusitis, unspecified: Secondary | ICD-10-CM | POA: Diagnosis not present

## 2024-02-26 DIAGNOSIS — R5383 Other fatigue: Secondary | ICD-10-CM

## 2024-02-26 DIAGNOSIS — E559 Vitamin D deficiency, unspecified: Secondary | ICD-10-CM

## 2024-02-26 DIAGNOSIS — E1165 Type 2 diabetes mellitus with hyperglycemia: Secondary | ICD-10-CM | POA: Diagnosis not present

## 2024-02-26 DIAGNOSIS — I10 Essential (primary) hypertension: Secondary | ICD-10-CM | POA: Diagnosis not present

## 2024-02-26 DIAGNOSIS — Z0000 Encounter for general adult medical examination without abnormal findings: Secondary | ICD-10-CM | POA: Insufficient documentation

## 2024-02-26 DIAGNOSIS — Z7984 Long term (current) use of oral hypoglycemic drugs: Secondary | ICD-10-CM | POA: Diagnosis not present

## 2024-02-26 DIAGNOSIS — R7989 Other specified abnormal findings of blood chemistry: Secondary | ICD-10-CM | POA: Diagnosis not present

## 2024-02-26 LAB — MICROALBUMIN / CREATININE URINE RATIO
Creatinine,U: 23.2 mg/dL
Microalb Creat Ratio: 43.7 mg/g — ABNORMAL HIGH (ref 0.0–30.0)
Microalb, Ur: 1 mg/dL (ref 0.0–1.9)

## 2024-02-26 LAB — LIPID PANEL
Cholesterol: 126 mg/dL (ref 0–200)
HDL: 38.4 mg/dL — ABNORMAL LOW (ref >39.00)
LDL Cholesterol: 54 mg/dL (ref 0–99)
NonHDL: 87.38
Total CHOL/HDL Ratio: 3
Triglycerides: 167 mg/dL — ABNORMAL HIGH (ref 0.0–149.0)
VLDL: 33.4 mg/dL (ref 0.0–40.0)

## 2024-02-26 LAB — COMPREHENSIVE METABOLIC PANEL WITH GFR
ALT: 20 U/L (ref 0–35)
AST: 21 U/L (ref 0–37)
Albumin: 4.5 g/dL (ref 3.5–5.2)
Alkaline Phosphatase: 96 U/L (ref 39–117)
BUN: 22 mg/dL (ref 6–23)
CO2: 30 meq/L (ref 19–32)
Calcium: 9.9 mg/dL (ref 8.4–10.5)
Chloride: 100 meq/L (ref 96–112)
Creatinine, Ser: 0.89 mg/dL (ref 0.40–1.20)
GFR: 65.39 mL/min (ref >60.00)
Glucose, Bld: 177 mg/dL — ABNORMAL HIGH (ref 70–99)
Potassium: 4.7 meq/L (ref 3.5–5.1)
Sodium: 135 meq/L (ref 135–145)
Total Bilirubin: 0.3 mg/dL (ref 0.2–1.2)
Total Protein: 8.2 g/dL (ref 6.0–8.3)

## 2024-02-26 LAB — IRON: Iron: 96 ug/dL (ref 42–145)

## 2024-02-26 LAB — CBC
HCT: 41.1 % (ref 36.0–46.0)
Hemoglobin: 14 g/dL (ref 12.0–15.0)
MCHC: 34 g/dL (ref 30.0–36.0)
MCV: 88 fl (ref 78.0–100.0)
Platelets: 220 10*3/uL (ref 150.0–400.0)
RBC: 4.67 Mil/uL (ref 3.87–5.11)
RDW: 12.5 % (ref 11.5–15.5)
WBC: 8 10*3/uL (ref 4.0–10.5)

## 2024-02-26 LAB — T4, FREE: Free T4: 0.86 ng/dL (ref 0.60–1.60)

## 2024-02-26 LAB — HEMOGLOBIN A1C: Hgb A1c MFr Bld: 7.2 % — ABNORMAL HIGH (ref 4.6–6.5)

## 2024-02-26 LAB — VITAMIN B12: Vitamin B-12: 761 pg/mL (ref 211–911)

## 2024-02-26 LAB — T3, FREE: T3, Free: 2.6 pg/mL (ref 2.3–4.2)

## 2024-02-26 LAB — TSH: TSH: 0.35 u[IU]/mL (ref 0.35–5.50)

## 2024-02-26 LAB — VITAMIN D 25 HYDROXY (VIT D DEFICIENCY, FRACTURES): VITD: 83.83 ng/mL (ref 30.00–100.00)

## 2024-02-26 LAB — FERRITIN: Ferritin: 102.3 ng/mL (ref 10.0–291.0)

## 2024-02-26 MED ORDER — AZITHROMYCIN 250 MG PO TABS: ORAL_TABLET | ORAL | 0 refills | Status: AC

## 2024-02-26 NOTE — Patient Instructions (Signed)
 Check blood pressure at home daily and send me a mychart message with the readings in 1-2 weeks.

## 2024-02-26 NOTE — Assessment & Plan Note (Signed)
 Chronic Last A1c at goal at 7 Check A1c today for further evaluation In the meantime continue glipizide 5 mg daily and metformin 500 mg twice daily Continue olmesartan Check urine for albuminuria Patient prefers to remain off of cholesterol-lowering medication, discussed risks and benefits.  Per patient preference will not prescribe statin therapy at this time.

## 2024-02-26 NOTE — Assessment & Plan Note (Signed)
 Incidental finding Check thyroid panel, further recommendations may be made based upon the results

## 2024-02-26 NOTE — Progress Notes (Signed)
 Established Patient Office Visit  Subjective   Patient ID: Sheryl Mcknight, female    DOB: May 19, 1951  Age: 73 y.o. MRN: [id number]  Chief Complaint  Patient presents with   Hypertension    HTN/T2DM/HLD: At last office visit we reduced amlodipine from 10 mg to 5 mg daily due to patient's concern of intermittent dizziness.  She also continues on olmesartan 20 mg daily.  Overall is tolerating medication well.  Reports having drink caffeine before coming to the office today.  Last A1c 7.0.  Patient continues on glipizide 5 mg daily and metformin 5 mg twice daily tolerating this well.  Health Maintenance: Overdue for mammogram for breast cancer screening and colon cancer screening.  Low TSH: Incidental finding, patient arrives today to have follow-up labs completed.  Sinus pain: Present for 2 weeks, she is also experiencing nasal discharge and cough.    ROS: see HPI    Objective:     BP (!) 152/84   Pulse 93   Temp 98.6 F (37 C) (Temporal)   Ht 5\' 7"  (1.702 m)   Wt 166 lb 8 oz (75.5 kg)   SpO2 96%   BMI 26.08 kg/m  BP Readings from Last 3 Encounters:  02/26/24 (!) 152/84  10/23/23 116/62  07/05/22 138/74   Wt Readings from Last 3 Encounters:  02/26/24 166 lb 8 oz (75.5 kg)  02/16/24 171 lb (77.6 kg)  10/23/23 171 lb 4 oz (77.7 kg)      Physical Exam Vitals reviewed.  Constitutional:      General: She is not in acute distress.    Appearance: Normal appearance.  HENT:     Head: Normocephalic and atraumatic.     Right Ear: Hearing, tympanic membrane, ear canal and external ear normal.     Left Ear: Hearing, tympanic membrane, ear canal and external ear normal.  Cardiovascular:     Rate and Rhythm: Normal rate and regular rhythm.     Pulses: Normal pulses.     Heart sounds: Normal heart sounds.  Pulmonary:     Effort: Pulmonary effort is normal.     Breath sounds: Normal breath sounds.  Skin:    General: Skin is warm and dry.  Neurological:     General: No  focal deficit present.     Mental Status: She is alert and oriented to person, place, and time.  Psychiatric:        Mood and Affect: Mood normal.        Behavior: Behavior normal.        Judgment: Judgment normal.      Results for orders placed or performed in visit on 02/26/24  Ferritin  Result Value Ref Range   Ferritin 102.3 10.0 - 291.0 ng/mL  Iron  Result Value Ref Range   Iron 96 42 - 145 ug/dL  Comprehensive metabolic panel with GFR  Result Value Ref Range   Sodium 135 135 - 145 mEq/L   Potassium 4.7 3.5 - 5.1 mEq/L   Chloride 100 96 - 112 mEq/L   CO2 30 19 - 32 mEq/L   Glucose, Bld 177 (H) 70 - 99 mg/dL   BUN 22 6 - 23 mg/dL   Creatinine, Ser 4.09 0.40 - 1.20 mg/dL   Total Bilirubin 0.3 0.2 - 1.2 mg/dL   Alkaline Phosphatase 96 39 - 117 U/L   AST 21 0 - 37 U/L   ALT 20 0 - 35 U/L   Total Protein 8.2 6.0 - 8.3  g/dL   Albumin 4.5 3.5 - 5.2 g/dL   GFR 16.10 >96.04 mL/min   Calcium 9.9 8.4 - 10.5 mg/dL  CBC  Result Value Ref Range   WBC 8.0 4.0 - 10.5 K/uL   RBC 4.67 3.87 - 5.11 Mil/uL   Platelets 220.0 150.0 - 400.0 K/uL   Hemoglobin 14.0 12.0 - 15.0 g/dL   HCT 54.0 98.1 - 19.1 %   MCV 88.0 78.0 - 100.0 fl   MCHC 34.0 30.0 - 36.0 g/dL   RDW 47.8 29.5 - 62.1 %  Vitamin B12  Result Value Ref Range   Vitamin B-12 761 211 - 911 pg/mL  VITAMIN D 25 Hydroxy (Vit-D Deficiency, Fractures)  Result Value Ref Range   VITD 83.83 30.00 - 100.00 ng/mL  Hemoglobin A1c  Result Value Ref Range   Hgb A1c MFr Bld 7.2 (H) 4.6 - 6.5 %  Lipid panel  Result Value Ref Range   Cholesterol 126 0 - 200 mg/dL   Triglycerides 308.6 (H) 0.0 - 149.0 mg/dL   HDL 57.84 (L) >69.62 mg/dL   VLDL 95.2 0.0 - 84.1 mg/dL   LDL Cholesterol 54 0 - 99 mg/dL   Total CHOL/HDL Ratio 3    NonHDL 87.38   Microalbumin / creatinine urine ratio  Result Value Ref Range   Microalb, Ur 1.0 0.0 - 1.9 mg/dL   Creatinine,U 32.4 mg/dL   Microalb Creat Ratio 43.7 (H) 0.0 - 30.0 mg/g  T4, free  Result  Value Ref Range   Free T4 0.86 0.60 - 1.60 ng/dL  T3, free  Result Value Ref Range   T3, Free 2.6 2.3 - 4.2 pg/mL  TSH  Result Value Ref Range   TSH 0.35 0.35 - 5.50 uIU/mL      The ASCVD Risk score (Arnett DK, et al., 2019) failed to calculate for the following reasons:   The valid total cholesterol range is 130 to 320 mg/dL    Assessment & Plan:   Problem List Items Addressed This Visit       Cardiovascular and Mediastinum   Hypertension   Chronic, blood pressure above goal today. Patient does report drinking caffeinated beverage before coming to office. Patient is able and willing to check blood pressure at home, she will check it daily for a week or 2 and then send MyChart message.  If blood pressure remains above goal at home we will increase amlodipine back to 10 mg/day.  She will also continue on olmesartan 20 mg daily.        Respiratory   Acute non-recurrent sinusitis   Acute Treat with course of azithromycin Call office if symptoms persist or worsen      Relevant Medications   azithromycin (ZITHROMAX) 250 MG tablet     Endocrine   Type 2 diabetes mellitus with hyperglycemia, without long-term current use of insulin (HCC)   Chronic Last A1c at goal at 7 Check A1c today for further evaluation In the meantime continue glipizide 5 mg daily and metformin 500 mg twice daily Continue olmesartan Check urine for albuminuria Patient prefers to remain off of cholesterol-lowering medication, discussed risks and benefits.  Per patient preference will not prescribe statin therapy at this time.      Relevant Orders   Microalbumin / creatinine urine ratio (Completed)   Lipid panel (Completed)   Hemoglobin A1c (Completed)     Other   Vitamin D deficiency disease   Labs ordered, further recommendations may be made based upon these results  Relevant Orders   VITAMIN D 25 Hydroxy (Vit-D Deficiency, Fractures) (Completed)   Healthcare maintenance   Again  encouraged referral for screening mammogram and colon cancer screening. Declined referral to have mammogram and colon cancer screening. Patient was told to let me know when she would like to be referred for these.      Low TSH level - Primary   Incidental finding Check thyroid panel, further recommendations may be made based upon the results      Relevant Orders   TSH (Completed)   T3, free (Completed)   T4, free (Completed)   Anti-TPO Ab (RDL)   Fatigue   Labs ordered, further recommendations may be made based upon his results       Relevant Orders   Vitamin B12 (Completed)   CBC (Completed)   Comprehensive metabolic panel with GFR (Completed)   Iron (Completed)   Ferritin (Completed)    Return in about 6 months (around 08/27/2024) for F/U with Felipe Paluch.    Zorita Hiss, NP

## 2024-02-26 NOTE — Assessment & Plan Note (Signed)
 Chronic, blood pressure above goal today. Patient does report drinking caffeinated beverage before coming to office. Patient is able and willing to check blood pressure at home, she will check it daily for a week or 2 and then send MyChart message.  If blood pressure remains above goal at home we will increase amlodipine back to 10 mg/day.  She will also continue on olmesartan 20 mg daily.

## 2024-02-26 NOTE — Assessment & Plan Note (Signed)
 Again encouraged referral for screening mammogram and colon cancer screening. Declined referral to have mammogram and colon cancer screening. Patient was told to let me know when she would like to be referred for these.

## 2024-02-26 NOTE — Assessment & Plan Note (Signed)
 Labs ordered, further recommendations may be made based upon his results.

## 2024-02-26 NOTE — Assessment & Plan Note (Signed)
 Acute Treat with course of azithromycin Call office if symptoms persist or worsen

## 2024-02-26 NOTE — Assessment & Plan Note (Signed)
 Labs ordered, further recommendations may be made based upon these results

## 2024-03-05 ENCOUNTER — Other Ambulatory Visit: Payer: Self-pay | Admitting: Nurse Practitioner

## 2024-03-05 DIAGNOSIS — E1165 Type 2 diabetes mellitus with hyperglycemia: Secondary | ICD-10-CM

## 2024-03-05 LAB — ANTI-TPO AB (RDL): Anti-TPO Ab (RDL): <9 [IU]/mL (ref <9.0)

## 2024-03-05 MED ORDER — FARXIGA 10 MG PO TABS: 10.0000 mg | ORAL_TABLET | Freq: Every day | ORAL | 1 refills | Status: DC

## 2024-03-21 ENCOUNTER — Other Ambulatory Visit: Payer: Self-pay | Admitting: Nurse Practitioner

## 2024-03-21 DIAGNOSIS — E1165 Type 2 diabetes mellitus with hyperglycemia: Secondary | ICD-10-CM

## 2024-04-19 ENCOUNTER — Telehealth: Payer: Self-pay | Admitting: Nurse Practitioner

## 2024-04-19 NOTE — Telephone Encounter (Signed)
 Copied from CRM [id number]. Topic: General - Other >> Apr 19, 2024 12:19 PM Chuck Crater wrote: Reason for CRM: Crystal with [facility] is calling to see if drug therapy alert for Statin Therapy was recieved. Last fax 06/06 and 05/25 before that.

## 2024-05-06 NOTE — Telephone Encounter (Signed)
 Forms never received via provider basket or e-faxes. No number was provided to be able to call about forms.

## 2024-07-13 ENCOUNTER — Other Ambulatory Visit: Payer: Self-pay | Admitting: Nurse Practitioner

## 2024-07-13 DIAGNOSIS — I10 Essential (primary) hypertension: Secondary | ICD-10-CM

## 2024-08-06 ENCOUNTER — Other Ambulatory Visit: Payer: Self-pay | Admitting: Nurse Practitioner

## 2024-08-06 DIAGNOSIS — I10 Essential (primary) hypertension: Secondary | ICD-10-CM

## 2024-08-27 ENCOUNTER — Other Ambulatory Visit: Payer: Self-pay | Admitting: Nurse Practitioner

## 2024-08-27 DIAGNOSIS — E1165 Type 2 diabetes mellitus with hyperglycemia: Secondary | ICD-10-CM

## 2024-08-31 ENCOUNTER — Telehealth: Payer: Self-pay

## 2024-08-31 NOTE — Telephone Encounter (Signed)
 Copied from CRM [id number]. Topic: General - Other >> Aug 30, 2024  5:08 PM Nessti S wrote: Reason for CRM: ashley from [facility] called to verify that fax for hedis stars measure was sent. Call back number [phone]

## 2024-09-03 NOTE — Telephone Encounter (Signed)
 Spoke to [place] from [place], made them aware that we have not receive any fax and if they can refax it again. Was told they would.

## 2024-09-15 ENCOUNTER — Other Ambulatory Visit: Payer: Self-pay | Admitting: Nurse Practitioner

## 2024-09-15 DIAGNOSIS — E1165 Type 2 diabetes mellitus with hyperglycemia: Secondary | ICD-10-CM

## 2024-09-28 ENCOUNTER — Other Ambulatory Visit: Payer: Self-pay | Admitting: Family

## 2024-09-28 DIAGNOSIS — I10 Essential (primary) hypertension: Secondary | ICD-10-CM

## 2024-10-08 ENCOUNTER — Other Ambulatory Visit: Payer: Self-pay | Admitting: Nurse Practitioner

## 2024-10-08 DIAGNOSIS — E1165 Type 2 diabetes mellitus with hyperglycemia: Secondary | ICD-10-CM

## 2024-10-14 ENCOUNTER — Ambulatory Visit: Admitting: Nurse Practitioner

## 2024-12-02 ENCOUNTER — Ambulatory Visit: Admitting: Nurse Practitioner

## 2024-12-02 VITALS — BP 156/82 | HR 89 | Temp 98.0°F | Ht 67.0 in | Wt 166.0 lb

## 2024-12-02 DIAGNOSIS — E1165 Type 2 diabetes mellitus with hyperglycemia: Secondary | ICD-10-CM | POA: Diagnosis not present

## 2024-12-02 DIAGNOSIS — E559 Vitamin D deficiency, unspecified: Secondary | ICD-10-CM

## 2024-12-02 DIAGNOSIS — E785 Hyperlipidemia, unspecified: Secondary | ICD-10-CM | POA: Diagnosis not present

## 2024-12-02 DIAGNOSIS — I10 Essential (primary) hypertension: Secondary | ICD-10-CM | POA: Diagnosis not present

## 2024-12-02 DIAGNOSIS — Z7984 Long term (current) use of oral hypoglycemic drugs: Secondary | ICD-10-CM | POA: Diagnosis not present

## 2024-12-02 LAB — POCT GLYCOSYLATED HEMOGLOBIN (HGB A1C): Hemoglobin A1C: 7.2 % — AB (ref 4.0–5.6)

## 2024-12-02 MED ORDER — AMLODIPINE BESYLATE 10 MG PO TABS: 10.0000 mg | ORAL_TABLET | Freq: Every day | ORAL | 3 refills | Status: AC

## 2024-12-02 MED ORDER — METFORMIN HCL 500 MG PO TABS: ORAL_TABLET | ORAL | 1 refills | Status: AC

## 2024-12-02 NOTE — Assessment & Plan Note (Signed)
 Chronic, above goal Discussed lifestyle changes, reports exercising 3-4 times a day. Patient still declines starting a statin. Risk vs. Benefits discussed.

## 2024-12-02 NOTE — Assessment & Plan Note (Addendum)
 Chronic, uncontolled Today A1C: 7.2 unchanged from lat visit. Pt. Reports fasting blood sugars range 115-130 at home. Reports low blood sugar 70's between 4-5pm. Reports shakiness and jittery feeling. Plan to d/c glipizide , but will increase Metformin  to 1000mg  daily and 500mg  at bedtime and cont Farxgia 10mg  daily. Pt prefers to have a 3 month follow-up instead of 4-6 weeks as advised. Pt agreed to monitor blood sugar at home and report any blood sugar less than 70's and/or lightheadedness or dizziness.

## 2024-12-02 NOTE — Assessment & Plan Note (Addendum)
 Chronic, above goal Blood pressure above goal today 156/82. Pt reports bloods range 140-150/70-80 at home. Pt. Agreed to increased Norvasc  to 10mg  daily and cont Olmesartan  20mg  daily. Pt prefers to have a 3 month follow-up instead of 4-6 weeks as advised. Pt agreed to monitor blood pressure at home and report any b/p less than 120/80 and/or lightheadedness or dizziness.

## 2024-12-02 NOTE — Progress Notes (Signed)
 "   Established Patient Office Visit  Subjective   Patient ID: Sheryl Mcknight, female    DOB: 1951-03-29  Age: 74 y.o. MRN: [id number]  Chief Complaint  Patient presents with   Medical Management of Chronic Issues    Follow up on diabetes and also having issue with her knee after some heaving lifting     HPI Pt reports blood pressures range 140-150/70-80 at home. Reports taking blood pressure medications as prescribed.  Pt. reports fasting blood sugars range 115-130 at home. Reports low blood sugar ranging in the 70's between 4p-5p. Report shakiness and jittery feeling. Reports eating a snack to help bring her blood sugar back up to 90-94.    Review of Systems  Constitutional:  Negative for chills, fever, malaise/fatigue and weight loss.  HENT:  Negative for congestion, ear discharge and nosebleeds.   Respiratory:  Negative for cough and shortness of breath.   Cardiovascular:  Negative for chest pain, palpitations, claudication and leg swelling.  Gastrointestinal:  Negative for diarrhea, nausea and vomiting.  Skin:  Negative for itching and rash.  Neurological:  Negative for dizziness, tingling, weakness and headaches.  Psychiatric/Behavioral:  Negative for memory loss. The patient does not have insomnia.       Objective:     BP (!) 156/82   Pulse 89   Temp 98 F (36.7 C) (Temporal)   Ht 5' 7 (1.702 m)   Wt 166 lb (75.3 kg)   SpO2 98%   BMI 26.00 kg/m    Physical Exam Constitutional:      General: She is not in acute distress.    Appearance: Normal appearance.  HENT:     Head: Normocephalic.     Nose: No congestion.  Cardiovascular:     Rate and Rhythm: Normal rate and regular rhythm.  Pulmonary:     Effort: Pulmonary effort is normal. No respiratory distress.     Breath sounds: Normal breath sounds. No wheezing.  Musculoskeletal:        General: No swelling.  Skin:    General: Skin is warm and dry.     Coloration: Skin is not pale.     Findings: No bruising  or erythema.  Neurological:     Mental Status: She is alert and oriented to person, place, and time.  Psychiatric:        Mood and Affect: Mood normal.        Behavior: Behavior normal.        Thought Content: Thought content normal.        Judgment: Judgment normal.      No results found for any visits on 12/02/24.  Last CBC Lab Results  Component Value Date   WBC 8.0 02/26/2024   HGB 14.0 02/26/2024   HCT 41.1 02/26/2024   MCV 88.0 02/26/2024   MCH 29.7 11/10/2023   RDW 12.5 02/26/2024   PLT 220.0 02/26/2024   Last metabolic panel Lab Results  Component Value Date   GLUCOSE 177 (H) 02/26/2024   NA 135 02/26/2024   K 4.7 02/26/2024   CL 100 02/26/2024   CO2 30 02/26/2024   BUN 22 02/26/2024   CREATININE 0.89 02/26/2024   GFR 65.39 02/26/2024   CALCIUM 9.9 02/26/2024   PROT 8.2 02/26/2024   ALBUMIN 4.5 02/26/2024   BILITOT 0.3 02/26/2024   ALKPHOS 96 02/26/2024   AST 21 02/26/2024   ALT 20 02/26/2024   Last lipids Lab Results  Component Value Date   CHOL  126 02/26/2024   HDL 38.40 (L) 02/26/2024   LDLCALC 54 02/26/2024   LDLDIRECT 68.0 01/04/2022   TRIG 167.0 (H) 02/26/2024   CHOLHDL 3 02/26/2024   Last hemoglobin A1c Lab Results  Component Value Date   HGBA1C 7.2 (H) 02/26/2024         Assessment & Plan:   Problem List Items Addressed This Visit       Cardiovascular and Mediastinum   Hypertension   Chronic, above goal Blood pressure above goal today 156/82. Pt reports bloods range 140-150/70-80 at home. Pt. Agreed to increased Norvasc  to 10mg  daily and cont Olmesartan  20mg  daily. Pt prefers to have a 3 month follow-up instead of 4-6 weeks as advised. Pt agreed to monitor blood pressure at home and report any b/p less than 120/80 and/or lightheadedness or dizziness.       Relevant Medications   amLODipine  (NORVASC ) 10 MG tablet     Endocrine   Type 2 diabetes mellitus with hyperglycemia, without long-term current use of insulin (HCC) -  Primary   Chronic, uncontolled Today A1C: 7.2 unchanged from lat visit. Pt. Reports fasting blood sugars range 115-130 at home. Reports low blood sugar 70's between 4-5pm. Reports shakiness and jittery feeling. Plan to d/c glipizide , but will increase Metformin  to 1000mg  daily and 500mg  at bedtime. Pt prefers to have a 3 month follow-up instead of 4-6 weeks as advised. Pt agreed to monitor blood sugar at home and report any blood sugar less than 70's and/or lightheadedness or dizziness.       Relevant Medications   metFORMIN  (GLUCOPHAGE ) 500 MG tablet   Other Relevant Orders   POCT HgB A1C     Other   Vitamin D  deficiency disease   Chronic, stable Last Vit. D level 83.83. Continues on a daily Vit. D supplement.      HLD (hyperlipidemia)   Chronic, above goal Discussed lifestyle changes, reports exercising 3-4 times a day. Patient still declines starting a statin. Risk vs. Benefits discussed.      Relevant Medications   amLODipine  (NORVASC ) 10 MG tablet   Other Relevant Orders   POCT HgB A1C    Return in about 3 months (around 03/02/2025) for F/U with Lauraine.    Tinnie LITTIE Limes, RN  "

## 2024-12-02 NOTE — Assessment & Plan Note (Signed)
 Chronic, stable Last Vit. D level 83.83. Continues on a daily Vit. D supplement.

## 2025-02-16 ENCOUNTER — Ambulatory Visit

## 2025-03-04 ENCOUNTER — Ambulatory Visit: Admitting: Nurse Practitioner

## 2025-03-15 ENCOUNTER — Ambulatory Visit
